# Patient Record
Sex: Female | Born: 1935 | Race: Black or African American | Hispanic: No | Smoking: Never smoker
Health system: Southern US, Community
[De-identification: ages and names within clinical notes are randomized; demographics above are authoritative.]

## PROBLEM LIST (undated history)

## (undated) DIAGNOSIS — K529 Noninfective gastroenteritis and colitis, unspecified: Secondary | ICD-10-CM

## (undated) DIAGNOSIS — R55 Syncope and collapse: Secondary | ICD-10-CM

## (undated) DIAGNOSIS — I4891 Unspecified atrial fibrillation: Secondary | ICD-10-CM

## (undated) DIAGNOSIS — I10 Essential (primary) hypertension: Secondary | ICD-10-CM

## (undated) DIAGNOSIS — C55 Malignant neoplasm of uterus, part unspecified: Secondary | ICD-10-CM

## (undated) DIAGNOSIS — M1990 Unspecified osteoarthritis, unspecified site: Secondary | ICD-10-CM

## (undated) HISTORY — PX: JOINT REPLACEMENT: SHX530

---

## 1998-07-30 ENCOUNTER — Encounter: Payer: Self-pay | Admitting: *Deleted

## 1998-07-30 ENCOUNTER — Ambulatory Visit: Admission: RE | Admit: 1998-07-30 | Discharge: 1998-07-30 | Payer: Self-pay | Admitting: *Deleted

## 2001-08-29 ENCOUNTER — Emergency Department (HOSPITAL_COMMUNITY): Admission: EM | Admit: 2001-08-29 | Discharge: 2001-08-29 | Payer: Self-pay | Admitting: Emergency Medicine

## 2007-07-27 HISTORY — PX: TOTAL KNEE ARTHROPLASTY: SHX125

## 2007-07-31 ENCOUNTER — Inpatient Hospital Stay (HOSPITAL_COMMUNITY): Admission: RE | Admit: 2007-07-31 | Discharge: 2007-08-04 | Payer: Self-pay | Admitting: Specialist

## 2010-06-11 ENCOUNTER — Emergency Department (HOSPITAL_COMMUNITY)
Admission: EM | Admit: 2010-06-11 | Discharge: 2010-06-12 | Payer: Self-pay | Source: Home / Self Care | Admitting: Emergency Medicine

## 2010-06-11 ENCOUNTER — Emergency Department (HOSPITAL_COMMUNITY): Admission: EM | Admit: 2010-06-11 | Discharge: 2010-06-11 | Payer: Self-pay | Admitting: Emergency Medicine

## 2010-12-08 LAB — POCT I-STAT, CHEM 8
BUN: 9 mg/dL (ref 6–23)
Calcium, Ion: 1 mmol/L — ABNORMAL LOW (ref 1.12–1.32)
Chloride: 106 mEq/L (ref 96–112)
Creatinine, Ser: 0.7 mg/dL (ref 0.4–1.2)
Glucose, Bld: 107 mg/dL — ABNORMAL HIGH (ref 70–99)
HCT: 50 % — ABNORMAL HIGH (ref 36.0–46.0)
Hemoglobin: 17 g/dL — ABNORMAL HIGH (ref 12.0–15.0)
Potassium: 3.9 mEq/L (ref 3.5–5.1)
Sodium: 139 mEq/L (ref 135–145)
TCO2: 27 mmol/L (ref 0–100)

## 2010-12-08 LAB — CBC
HCT: 46.3 % — ABNORMAL HIGH (ref 36.0–46.0)
Hemoglobin: 15.4 g/dL — ABNORMAL HIGH (ref 12.0–15.0)
MCH: 27.9 pg (ref 26.0–34.0)
MCHC: 33.3 g/dL (ref 30.0–36.0)
MCV: 84 fL (ref 78.0–100.0)
Platelets: 165 10*3/uL (ref 150–400)
RBC: 5.51 MIL/uL — ABNORMAL HIGH (ref 3.87–5.11)
RDW: 13.2 % (ref 11.5–15.5)
WBC: 6.1 10*3/uL (ref 4.0–10.5)

## 2010-12-08 LAB — DIFFERENTIAL
Basophils Absolute: 0 10*3/uL (ref 0.0–0.1)
Basophils Relative: 1 % (ref 0–1)
Eosinophils Absolute: 0 10*3/uL (ref 0.0–0.7)
Eosinophils Relative: 0 % (ref 0–5)
Lymphocytes Relative: 17 % (ref 12–46)
Lymphs Abs: 1.1 10*3/uL (ref 0.7–4.0)
Monocytes Absolute: 0.4 10*3/uL (ref 0.1–1.0)
Monocytes Relative: 6 % (ref 3–12)
Neutro Abs: 4.6 10*3/uL (ref 1.7–7.7)
Neutrophils Relative %: 76 % (ref 43–77)

## 2010-12-08 LAB — URINE CULTURE
Colony Count: 15000
Culture  Setup Time: 201109181843

## 2010-12-08 LAB — URINALYSIS, ROUTINE W REFLEX MICROSCOPIC
Ketones, ur: NEGATIVE mg/dL
Protein, ur: NEGATIVE mg/dL
Urobilinogen, UA: 0.2 mg/dL (ref 0.0–1.0)

## 2010-12-08 LAB — URINE MICROSCOPIC-ADD ON

## 2011-02-07 NOTE — H&P (Signed)
NAME:  Amanda Rocha, Amanda Rocha NO.:  [id number]   MEDICAL RECORD NO.:  [id number]         PATIENT TYPE:  LINP   LOCATION:                               FACILITY:  [facility]   PHYSICIAN:  Erasmo Leventhal, M.D.DATE OF BIRTH:  03-06-1921   DATE OF ADMISSION:  07/31/2007  DATE OF DISCHARGE:                              HISTORY & PHYSICAL   CHIEF COMPLAINT:  Left knee end-stage osteoarthritis.   HISTORY OF PRESENT ILLNESS:  This is an 77 year old lady with a history  of bilateral knee osteoarthritis, left greater than right, who has  failed conservative management to control her pain and discomfort.  Due  to daily pain with daily activities she is now scheduled for total knee  arthroplasty of the left knee.  The surgery risks, benefits and  aftercare were discussed in detail with the patient, questions invited  and answered.  She has received medical clearance from her medical  doctor, Dr. Fleet Contras at [facility] and surgery will go  ahead as scheduled.   PAST MEDICAL HISTORY:  Drug allergies:  None.   Current medications:  1. Benicar/hydrochlorothiazide 20/12.5 one daily.  2. Verapamil 240 mg one daily.  3. Polyethylene glycol 3350 mg 8 ounces daily.   Past surgeries:  Tubal excision for ectopic pregnancy.  Serious medical  illnesses:  Hypertension.   FAMILY HISTORY:  Positive for colon cancer.   SOCIAL HISTORY:  The patient is widowed.  She lives at home with her  son.  She does not smoke and does not drink.   REVIEW OF SYSTEMS:  CENTRAL NERVOUS SYSTEM:  Negative for headache  blurry vision or dizziness.  PULMONARY:  Negative for shortness breath,  PND or orthopnea.  CARDIOVASCULAR:  Negative for chest pain,  palpitation.  GI:  Negative for ulcers, hepatitis.  GU: Negative for  urinary tract difficulty.  MUSCULOSKELETAL:  Positive as in HPI.   PHYSICAL EXAMINATION:  BP 142/78, respirations 16, pulse 72 and regular.  GENERAL APPEARANCE:  This is a  well-developed, well-nourished lady in no  acute distress.  HEENT:  Head is normocephalic.  Nose patent.  Ears patent.  Pupils  equal, round, reactive to light.  Throat without injection.  NECK:  Supple without adenopathy.  Carotids 2+ without bruit.  CHEST:  Clear to auscultation.  No rales or rhonchi.  Respirations 16.  HEART:  Regular rate and rhythm at 72 beats per without murmur.  ABDOMEN:  Soft with active bowel sounds.  No masses or organomegaly.  NEUROLOGIC:  The patient alert and oriented to time, place and person.  Cranial nerves II-XII grossly intact.  EXTREMITIES:  Shows left knee with a varus deformity, 0-95 degree range  of motion with pain at the end.  Neurovascular status intact.  Dorsalis  pedis and posterior tibialis pulses are 1+.   X-rays show end-stage osteoarthritis left knee.   IMPRESSION:  End-stage osteoarthritis left knee.   PLAN:  Total knee arthroplasty left knee.      Jaquelyn Bitter. Chabon, P.A.    ______________________________  Erasmo Leventhal, M.D.    SJC/MEDQ  D:  07/17/2007  T:  07/18/2007  Job:  [id number]

## 2011-02-07 NOTE — Op Note (Signed)
NAME:  Amanda Rocha, Amanda Rocha NO.:  [id number]   MEDICAL RECORD NO.:  [id number]          PATIENT TYPE:  INP   LOCATION:  1614                         FACILITY:  [facility]   PHYSICIAN:  Erasmo Leventhal, M.D.DATE OF BIRTH:  11/13/1920   DATE OF PROCEDURE:  07/31/2007  DATE OF DISCHARGE:                               OPERATIVE REPORT   PREOPERATIVE DIAGNOSES:  Left knee end stage osteoarthritis.   POSTOPERATIVE DIAGNOSES:  Left knee end stage osteoarthritis.   PROCEDURE:  Left total knee arthroplasty.   SURGEON:  Erasmo Leventhal, M.D.   ASSISTANT:  Jaquelyn Bitter. Chabon, P.A.   ANESTHESIA:  Spinal with monitored anesthesia care.   BLOOD LOSS:  Less than 100 mL.   DRAINS:  One medium Hemovac.   COMPLICATIONS:  None.   TOURNIQUET TIME:  Seventy-five minutes at 300 mm mercury.   DESCRIPTION OF PROCEDURE:  The patient was counseled in the holding  area.  IV was started.  Taken to the operating room.  Spinal anesthetic  was administered.  IV antibiotics were given.  Foley catheter placed  utilizing sterile technique by the OR circulating nurse.  Left knee was  examined, full extension and flexion to 110.  Elevated, prepped with  DuraPrep and draped in a sterile fashion.  Exsanguinated with an Esmarch  and tourniquet inflated to 300 mmHg.  Straight midline incision was made  in the skin and subcutaneous tissue.  Medial soft tissue flaps were  developed.  Medial parapatellar arthrotomy was performed.  Proximal  medial soft tissue release was done.  Knee was then flexed.  End-stage  arthritis changes, bone against bone.  Cruciate ligaments were resected.  Starter hole made distal femur, canal was irrigated and effluent was  clear.  Intermedullary rod was gently placed.  We chose a 5 degree  valgus cut and took a 10 mm cut off the distal femur.  Distal femur had  multiple osteophytes.  They were removed.  The distal femur was found to  be a size #4.  Rotation marks  were set.  This fit the cut to size #4.  Medial and lateral menisci removed under direct visualization.  Geniculate vessels were coagulated.  Osteophytes removed across the  medial tibia.  The tibia was resected, proximal tibia found be a size  #3.  The center aspect was identified, step reamer, irrigated.  The  intermedullary rod was gently placed.  It took a 0 degree slope and took  a 10-mm cut off the lateral side which went to the deep edge on the  medial side.  Posterior medial and posterior femoral osteophytes were  gently removed under direct visualization.  With flexion extension  blocks for 10 we were well balanced.  Tibial baseplate was applied.  Rotation cover set, reamer and punch performed, box cut was now  performed on the femur.  At this time a size four femur, size three  tibia, 10 insert well balanced and aligned knee, patellofemoral track  was anatomic.  Patella was found to be a size 35.  Osteophytes were  removed.  Appropriate amount of bone was resected.  Locking holes were  made and patella button tracked anatomically.  All trials removed and  the knee was irrigated with pulsatile lavage.  Utilizing modern cement  technique, all components were cemented in place, size four femur, size  three tibia with the 35 mm patella.  The cement cured.  Excess cement  was removed.  The knee was again irrigated.  With a 12.5 trial insert we  had full extension, flexion to  120, length __________ .  Stable to  varus and valgus stress.  Patellofemoral tracking was anatomic.  The  trial was removed.  The knee was again irrigated and a final 12.5 mm  posterior stabilized rotating platform tibial insert was implanted.  Knee was then rechecked, well-balanced. excellent motion and stability  and patella track was anatomic.  Irrigated again.  Medium Hemovac drain  was placed.  Sequential closure layers done.  The arthrotomy was closed  with Vicryl at 90 degrees of flexion, subcu Vicryl,  skin  with a  subcuticular Monocryl suture.  Steri-Strips were applied.  Drain was  hooked to suction.  Sterile dressings applied.  Tourniquet was deflated.  Another gram of Ancef given at the end of the case.  Excellent  circulation of the foot and ankle at the end of the case.  She was then  taken to the recovery room in stable condition.  Sponge and instrument  count correct.  No complications or problems.   Implants used were [employer] Sigma size four femur, size  three tibia. 12.5 posterior stabilized rotating platform tibial insert,  35 patella, all cemented.   To help with surgical decision making and technique, Mr. Leilani Able,  PA-C assisted in the entire case.           ______________________________  Erasmo Leventhal, M.D.     RAC/MEDQ  D:  07/31/2007  T:  08/01/2007  Job:  [id number]

## 2011-02-10 NOTE — Discharge Summary (Signed)
Amanda Rocha, Amanda Rocha NO.:  [id number]   MEDICAL RECORD NO.:  [id number]          PATIENT TYPE:  INP   LOCATION:  1614                         FACILITY:  [facility]   PHYSICIAN:  Erasmo Leventhal, M.D.DATE OF BIRTH:  02-27-1921   DATE OF ADMISSION:  07/31/2007  DATE OF DISCHARGE:  08/04/2007                               DISCHARGE SUMMARY   ADMISSION DIAGNOSIS:  End-stage osteoarthritis left knee.   DISCHARGE DIAGNOSIS:  End-stage osteoarthritis left knee.   OPERATION:  Total knee arthroplasty right knee.   BRIEF HISTORY:  This is an 77 year old lady with a history of end-stage  osteoarthritis which has failed conservative management.  After  discussion of treatment options, the patient now admitted for total knee  arthroplasty.  Surgery's risks, benefits and aftercare were status in  detail with the patient.  Questions invited and answered.   LABORATORY VALUES:  Admission CBC within normal limits.  Hemoglobin/hematocrit reached a low of 8.5 and 25.3 on the December 8,  and came up on their own to 9.2 and 27.5.  Admission C-met showed the  glucose to be elevated.  She ran mildly elevated glucose throughout  admission with a high of 179.  Admission PT/PTT within normal limits  except PTT 41.  Admission urinalysis showed cloudy color, trace ketones  and trace leukocyte esterase.  Chest x-ray showed no cardiopulmonary  disease.   COURSE IN THE HOSPITAL:  The patient tolerated the operative procedure  well.  First postoperative day vital signs were stable.  She is feeling  good.  INR was good.  O2 sats 99 on 2 liters.  Hemoglobin 11.7,  hematocrit 33.3.  B-met within normal with the exception of glucose 179.  Lungs clear.  Heart sounds normal.  Bowel sounds sluggish.  Calves are  negative.  Drain was removed without difficulty.  She was switched to a  modified carb diet.  Second postoperative day, vital signs were stable.  Blood pressure was good.  Temperature  to 101.3, O2 90 on 2 liters.  I&O  is good.  Lung sounds with decreased sounds at the bases.  Heart sounds  normal.  Bowel sounds active.  Calves negative.  Dressing was changed.  Wound was benign.  The patient's IVs were switched to [facility].  She was put  on increased incentive spirometry, cough and deep breathe and discharge  plan was made for Sunday due to her continued mild hypertension, medical  consult was obtained and this was brought under control.  Postop day #3,  vital signs stable, afebrile.  She had mild hypokalemia which was  treated with p.o. K-Dur and the problem was encountered there.  Dressings were changed and wound was benign.  Calves were negative and  discharge plans were made for the following day and on the fourth  postoperative day in good condition with afebrile vital signs stable.  Hemoglobin/hematocrit acceptable.  Lungs clear.  Heart sounds normal.  Bowel sounds active and calves negative and dressed and the wound  benign.  The patient was discharged home for follow-up in the office.   CONDITION ON DISCHARGE:  Improved.  DISCHARGE MEDICATIONS:  1. Percocet 1-2 q.4-6 h p.r.n. pain.  2. Robaxin 500 one p.o. q.h.s. daily p.r.n. spasm.  3. Trinsicon one b.i.d. for anemia.  4. Lovenox 30 mg one shot each day at 6:00 a.m. and 6:00 p.m. and then      start taking 181 mg aspirin a day for 3 weeks after the Lovenox.      She is to be on a regular diet.  5. She is take metoprolol 25 mg 1/2 tablet p.o. q.a.m. and 1/2 tablet      p.o. q.p.m. until follow-up with her medical doctor.   DISCHARGE INSTRUCTIONS:  She is to keep the wound clean and dry, do her  home exercise and call if problems or questions arise.      Jaquelyn Bitter. Chabon, P.A.    ______________________________  Erasmo Leventhal, M.D.    SJC/MEDQ  D:  09/03/2007  T:  09/04/2007  Job:  [id number]

## 2011-07-04 LAB — CBC
HCT: 27.9 — ABNORMAL LOW
HCT: 33.3 — ABNORMAL LOW
Hemoglobin: 9.3 — ABNORMAL LOW
MCHC: 33.7
MCV: 82.3
MCV: 82.6
Platelets: 137 — ABNORMAL LOW
Platelets: 197
RBC: 3.38 — ABNORMAL LOW
WBC: 10
WBC: 6.1

## 2011-07-04 LAB — BASIC METABOLIC PANEL
BUN: 1 — ABNORMAL LOW
BUN: 4 — ABNORMAL LOW
BUN: 6
CO2: 31
Chloride: 100
Chloride: 98
Creatinine, Ser: 0.68
GFR calc Af Amer: >60
GFR calc non Af Amer: >60
Glucose, Bld: 117 — ABNORMAL HIGH
Potassium: 3.5
Potassium: 3.8

## 2011-07-04 LAB — HEMOGLOBIN AND HEMATOCRIT, BLOOD: Hemoglobin: 9.2 — ABNORMAL LOW

## 2011-07-04 LAB — CROSSMATCH: Antibody Screen: NEGATIVE

## 2011-07-05 LAB — DIFFERENTIAL
Basophils Absolute: 0
Basophils Relative: 1
Eosinophils Absolute: 0.1
Eosinophils Relative: 1
Monocytes Absolute: 0.4
Monocytes Relative: 7
Neutro Abs: 3.1

## 2011-07-05 LAB — URINALYSIS, ROUTINE W REFLEX MICROSCOPIC
Bilirubin Urine: NEGATIVE
Hgb urine dipstick: NEGATIVE
Nitrite: NEGATIVE
Specific Gravity, Urine: 1.023
pH: 5.5

## 2011-07-05 LAB — CBC
HCT: 41.1
Platelets: 228
WBC: 4.8

## 2011-07-05 LAB — COMPREHENSIVE METABOLIC PANEL
ALT: 13
AST: 19
Albumin: 3.9
Alkaline Phosphatase: 70
BUN: 12
Chloride: 99
Potassium: 5
Sodium: 140
Total Bilirubin: 0.7
Total Protein: 7

## 2011-07-05 LAB — URINE MICROSCOPIC-ADD ON

## 2015-04-23 DIAGNOSIS — H5203 Hypermetropia, bilateral: Secondary | ICD-10-CM | POA: Diagnosis not present

## 2017-04-24 ENCOUNTER — Encounter (HOSPITAL_COMMUNITY): Payer: Self-pay | Admitting: Emergency Medicine

## 2017-04-24 ENCOUNTER — Inpatient Hospital Stay (HOSPITAL_COMMUNITY)
Admission: EM | Admit: 2017-04-24 | Discharge: 2017-04-29 | DRG: 310 | Disposition: A | Discharge status: Home / Self Care | Payer: Medicare Other | Attending: Internal Medicine | Admitting: Internal Medicine

## 2017-04-24 ENCOUNTER — Observation Stay (HOSPITAL_COMMUNITY): Payer: Medicare Other

## 2017-04-24 ENCOUNTER — Emergency Department (HOSPITAL_COMMUNITY): Payer: Medicare Other

## 2017-04-24 DIAGNOSIS — D638 Anemia in other chronic diseases classified elsewhere: Secondary | ICD-10-CM | POA: Diagnosis present

## 2017-04-24 DIAGNOSIS — I4891 Unspecified atrial fibrillation: Secondary | ICD-10-CM

## 2017-04-24 DIAGNOSIS — K310 Acute dilatation of stomach: Secondary | ICD-10-CM

## 2017-04-24 DIAGNOSIS — I480 Paroxysmal atrial fibrillation: Secondary | ICD-10-CM | POA: Diagnosis not present

## 2017-04-24 DIAGNOSIS — R739 Hyperglycemia, unspecified: Secondary | ICD-10-CM

## 2017-04-24 DIAGNOSIS — K529 Noninfective gastroenteritis and colitis, unspecified: Secondary | ICD-10-CM

## 2017-04-24 DIAGNOSIS — Z833 Family history of diabetes mellitus: Secondary | ICD-10-CM

## 2017-04-24 DIAGNOSIS — I4892 Unspecified atrial flutter: Secondary | ICD-10-CM | POA: Diagnosis present

## 2017-04-24 DIAGNOSIS — Z8249 Family history of ischemic heart disease and other diseases of the circulatory system: Secondary | ICD-10-CM

## 2017-04-24 DIAGNOSIS — R55 Syncope and collapse: Secondary | ICD-10-CM | POA: Diagnosis not present

## 2017-04-24 DIAGNOSIS — R778 Other specified abnormalities of plasma proteins: Secondary | ICD-10-CM | POA: Diagnosis present

## 2017-04-24 DIAGNOSIS — I444 Left anterior fascicular block: Secondary | ICD-10-CM | POA: Diagnosis present

## 2017-04-24 DIAGNOSIS — Z7982 Long term (current) use of aspirin: Secondary | ICD-10-CM

## 2017-04-24 DIAGNOSIS — I10 Essential (primary) hypertension: Secondary | ICD-10-CM | POA: Diagnosis not present

## 2017-04-24 DIAGNOSIS — R748 Abnormal levels of other serum enzymes: Secondary | ICD-10-CM | POA: Diagnosis not present

## 2017-04-24 DIAGNOSIS — Z79899 Other long term (current) drug therapy: Secondary | ICD-10-CM

## 2017-04-24 DIAGNOSIS — E860 Dehydration: Secondary | ICD-10-CM | POA: Diagnosis present

## 2017-04-24 DIAGNOSIS — K8689 Other specified diseases of pancreas: Secondary | ICD-10-CM | POA: Diagnosis present

## 2017-04-24 DIAGNOSIS — D500 Iron deficiency anemia secondary to blood loss (chronic): Secondary | ICD-10-CM | POA: Diagnosis present

## 2017-04-24 DIAGNOSIS — R7989 Other specified abnormal findings of blood chemistry: Secondary | ICD-10-CM

## 2017-04-24 DIAGNOSIS — R195 Other fecal abnormalities: Secondary | ICD-10-CM | POA: Diagnosis present

## 2017-04-24 HISTORY — DX: Unspecified atrial fibrillation: I48.91

## 2017-04-24 HISTORY — DX: Unspecified osteoarthritis, unspecified site: M19.90

## 2017-04-24 HISTORY — DX: Noninfective gastroenteritis and colitis, unspecified: K52.9

## 2017-04-24 HISTORY — DX: Syncope and collapse: R55

## 2017-04-24 HISTORY — DX: Malignant neoplasm of uterus, part unspecified: C55

## 2017-04-24 HISTORY — DX: Essential (primary) hypertension: I10

## 2017-04-24 LAB — HEPATIC FUNCTION PANEL
ALBUMIN: 3.7 g/dL (ref 3.5–5.0)
ALK PHOS: 70 U/L (ref 38–126)
ALT: 10 U/L — ABNORMAL LOW (ref 14–54)
AST: 29 U/L (ref 15–41)
Bilirubin, Direct: 0.2 mg/dL (ref 0.1–0.5)
Indirect Bilirubin: 0.4 mg/dL (ref 0.3–0.9)
TOTAL PROTEIN: 6.7 g/dL (ref 6.5–8.1)
Total Bilirubin: 0.6 mg/dL (ref 0.3–1.2)

## 2017-04-24 LAB — CBC
HCT: 43.7 % (ref 36.0–46.0)
Hemoglobin: 13.4 g/dL (ref 12.0–15.0)
MCH: 25.8 pg — AB (ref 26.0–34.0)
MCHC: 30.7 g/dL (ref 30.0–36.0)
MCV: 84.2 fL (ref 78.0–100.0)
PLATELETS: 245 10*3/uL (ref 150–400)
RBC: 5.19 MIL/uL — ABNORMAL HIGH (ref 3.87–5.11)
RDW: 13.9 % (ref 11.5–15.5)
WBC: 10.1 10*3/uL (ref 4.0–10.5)

## 2017-04-24 LAB — BASIC METABOLIC PANEL
Anion gap: 13 (ref 5–15)
BUN: 16 mg/dL (ref 6–20)
CHLORIDE: 99 mmol/L — AB (ref 101–111)
CO2: 23 mmol/L (ref 22–32)
CREATININE: 1.08 mg/dL — AB (ref 0.44–1.00)
Calcium: 9.7 mg/dL (ref 8.9–10.3)
GFR calc Af Amer: 50 mL/min — ABNORMAL LOW (ref >60)
GFR calc non Af Amer: 43 mL/min — ABNORMAL LOW (ref >60)
GLUCOSE: 223 mg/dL — AB (ref 65–99)
Potassium: 3.4 mmol/L — ABNORMAL LOW (ref 3.5–5.1)
Sodium: 135 mmol/L (ref 135–145)

## 2017-04-24 LAB — URINALYSIS, ROUTINE W REFLEX MICROSCOPIC
Bilirubin Urine: NEGATIVE
GLUCOSE, UA: 50 mg/dL — AB
Ketones, ur: NEGATIVE mg/dL
Nitrite: NEGATIVE
PH: 6 (ref 5.0–8.0)
PROTEIN: NEGATIVE mg/dL
Specific Gravity, Urine: 1.016 (ref 1.005–1.030)

## 2017-04-24 LAB — LIPASE, BLOOD: Lipase: 24 U/L (ref 11–51)

## 2017-04-24 LAB — TSH: TSH: 0.446 u[IU]/mL (ref 0.350–4.500)

## 2017-04-24 LAB — I-STAT TROPONIN, ED: TROPONIN I, POC: 0.07 ng/mL (ref 0.00–0.08)

## 2017-04-24 LAB — CBG MONITORING, ED: Glucose-Capillary: 177 mg/dL — ABNORMAL HIGH (ref 65–99)

## 2017-04-24 LAB — TROPONIN I: Troponin I: 0.12 ng/mL (ref <0.03)

## 2017-04-24 MED ORDER — ENOXAPARIN SODIUM 30 MG/0.3ML ~~LOC~~ SOLN
30.0000 mg | SUBCUTANEOUS | Status: DC
  Administered 2017-04-24 – 2017-04-28 (×4): 30 mg via SUBCUTANEOUS
  Filled 2017-04-24 (×4): qty 0.3

## 2017-04-24 MED ORDER — ASPIRIN EC 81 MG PO TBEC
81.0000 mg | DELAYED_RELEASE_TABLET | Freq: Every day | ORAL | Status: DC
  Administered 2017-04-25: 81 mg via ORAL
  Filled 2017-04-24 (×2): qty 1

## 2017-04-24 MED ORDER — SODIUM CHLORIDE 0.9 % IV SOLN
INTRAVENOUS | Status: AC
  Administered 2017-04-24: 21:00:00 via INTRAVENOUS

## 2017-04-24 MED ORDER — ONDANSETRON HCL 4 MG/2ML IJ SOLN
4.0000 mg | Freq: Four times a day (QID) | INTRAMUSCULAR | Status: DC | PRN
  Administered 2017-04-25 – 2017-04-27 (×4): 4 mg via INTRAVENOUS
  Filled 2017-04-24 (×5): qty 2

## 2017-04-24 MED ORDER — AMLODIPINE BESYLATE 5 MG PO TABS
5.0000 mg | ORAL_TABLET | Freq: Every day | ORAL | Status: DC
  Administered 2017-04-25 – 2017-04-28 (×4): 5 mg via ORAL
  Filled 2017-04-24 (×5): qty 1

## 2017-04-24 MED ORDER — INSULIN ASPART 100 UNIT/ML ~~LOC~~ SOLN
0.0000 [IU] | Freq: Three times a day (TID) | SUBCUTANEOUS | Status: DC
  Administered 2017-04-24 – 2017-04-27 (×2): 3 [IU] via SUBCUTANEOUS
  Filled 2017-04-24: qty 1

## 2017-04-24 MED ORDER — ACETAMINOPHEN 650 MG RE SUPP: 650.0000 mg | Freq: Four times a day (QID) | RECTAL | Status: DC | PRN

## 2017-04-24 MED ORDER — MORPHINE SULFATE (PF) 4 MG/ML IV SOLN
2.0000 mg | INTRAVENOUS | Status: DC | PRN
  Administered 2017-04-24: 2 mg via INTRAVENOUS
  Filled 2017-04-24: qty 1

## 2017-04-24 MED ORDER — ONDANSETRON HCL 4 MG PO TABS
4.0000 mg | ORAL_TABLET | Freq: Four times a day (QID) | ORAL | Status: DC | PRN
  Administered 2017-04-24 – 2017-04-25 (×2): 4 mg via ORAL
  Filled 2017-04-24 (×2): qty 1

## 2017-04-24 MED ORDER — SENNOSIDES-DOCUSATE SODIUM 8.6-50 MG PO TABS
1.0000 | ORAL_TABLET | Freq: Every evening | ORAL | Status: DC | PRN
  Administered 2017-04-28: 1 via ORAL
  Filled 2017-04-24: qty 1

## 2017-04-24 MED ORDER — ENOXAPARIN SODIUM 40 MG/0.4ML ~~LOC~~ SOLN
40.0000 mg | SUBCUTANEOUS | Status: DC
  Filled 2017-04-24: qty 0.4

## 2017-04-24 MED ORDER — HYDROCODONE-ACETAMINOPHEN 5-325 MG PO TABS
1.0000 | ORAL_TABLET | ORAL | Status: DC | PRN
  Administered 2017-04-24 – 2017-04-25 (×3): 1 via ORAL
  Filled 2017-04-24 (×3): qty 1

## 2017-04-24 MED ORDER — METRONIDAZOLE IN NACL 5-0.79 MG/ML-% IV SOLN
500.0000 mg | Freq: Three times a day (TID) | INTRAVENOUS | Status: DC
  Administered 2017-04-24 – 2017-04-25 (×2): 500 mg via INTRAVENOUS
  Filled 2017-04-24 (×4): qty 100

## 2017-04-24 MED ORDER — SODIUM CHLORIDE 0.9 % IV BOLUS (SEPSIS)
500.0000 mL | Freq: Once | INTRAVENOUS | Status: AC
  Administered 2017-04-24: 500 mL via INTRAVENOUS

## 2017-04-24 MED ORDER — DILTIAZEM HCL 25 MG/5ML IV SOLN
5.0000 mg | Freq: Once | INTRAVENOUS | Status: AC
  Administered 2017-04-24: 5 mg via INTRAVENOUS
  Filled 2017-04-24: qty 5

## 2017-04-24 MED ORDER — ACETAMINOPHEN 325 MG PO TABS: 650.0000 mg | ORAL_TABLET | Freq: Four times a day (QID) | ORAL | Status: DC | PRN

## 2017-04-24 MED ORDER — SODIUM CHLORIDE 0.9% FLUSH
3.0000 mL | Freq: Two times a day (BID) | INTRAVENOUS | Status: DC
  Administered 2017-04-24 – 2017-04-28 (×4): 3 mL via INTRAVENOUS

## 2017-04-24 MED ORDER — METOPROLOL TARTRATE 5 MG/5ML IV SOLN
5.0000 mg | Freq: Four times a day (QID) | INTRAVENOUS | Status: DC
  Administered 2017-04-24 – 2017-04-25 (×2): 5 mg via INTRAVENOUS
  Filled 2017-04-24 (×2): qty 5

## 2017-04-24 MED ORDER — IOPAMIDOL (ISOVUE-300) INJECTION 61%
INTRAVENOUS | Status: AC
  Administered 2017-04-24: 100 mL
  Filled 2017-04-24: qty 100

## 2017-04-24 MED ORDER — ONDANSETRON HCL 4 MG/2ML IJ SOLN
4.0000 mg | Freq: Once | INTRAMUSCULAR | Status: AC
  Administered 2017-04-24: 4 mg via INTRAVENOUS
  Filled 2017-04-24: qty 2

## 2017-04-24 MED ORDER — MORPHINE SULFATE (PF) 4 MG/ML IV SOLN: 1.0000 mg | INTRAVENOUS | Status: DC | PRN

## 2017-04-24 MED ORDER — TRAZODONE HCL 50 MG PO TABS: 25.0000 mg | ORAL_TABLET | Freq: Every evening | ORAL | Status: DC | PRN

## 2017-04-24 NOTE — ED Notes (Signed)
Pt received, aaox4, mae x 4 . Son in room with his mother

## 2017-04-24 NOTE — ED Notes (Signed)
DINNER TRAY ORDERED; LIQUID DIET

## 2017-04-24 NOTE — ED Triage Notes (Signed)
Per [facility]: pt having palpitations that began at 10:00 this am.  When EMS arrived pt had a syncopal episode that lasted approx 30 seconds while sitting on the bed.  Following episode pt experienced N/V with a BP of 86/42.  Denies CP.  Pt was given 4mg  of zofran and 500 ml NS bolus.  PTA vitals: 133/56 BP, 104 HR, 148 CBG, 96 SPO2 on RA, 18R.  Pt is A/O x4 upon arrival.

## 2017-04-24 NOTE — ED Provider Notes (Addendum)
[facility] DEPT Provider Note   CSN: [id number] Arrival date & time: 04/24/17  1249     History   Chief Complaint Chief Complaint  Patient presents with  . Palpitations  . Loss of Consciousness    HPI Amanda Rocha is a 83 y.o. female.  The history is provided by the patient.  Palpitations   This is a new problem. The current episode started 1 to 2 hours ago. The problem occurs rarely. The problem has been resolved. The problem is associated with an unknown factor. Associated symptoms include irregular heartbeat, near-syncope, abdominal pain, nausea and vomiting. Pertinent negatives include no fever, no chest pressure, no exertional chest pressure, no back pain, no lower extremity edema, no cough and no shortness of breath. She has tried nothing for the symptoms.   83 year old female who presents with palpitations and syncope. She has a history of hypertension. Reports having sudden onset of palpitations that began at 10 AM this morning, resolved on arrival to the ED. When EMS arrived, she had a syncopal episode that lasted for about 30 seconds when she was sitting in bed. Patient reports that she did not have any syncope. States that when she got up to go to the kitchen to get a glass of water she did feel extremely lightheaded, sick, and weak generally. After her unresponsive episode, she did have episode of nausea and vomiting began to complain of generalized abdominal pain. No diarrhea, melena, hematochezia, chest pain or difficulty breathing. She did receive antibiotics and 500 mL bolus as her blood pressure was low 86/42. On arrival to the ED she says that she feels sick/unwell. She did have a large bowel movement while here in the emergency department but continues to have generalized abdominal pain.  PCP: [facility]   Past Medical History:  Diagnosis Date  . Hypertension     There are no active problems to display for this patient.   No past surgical  history on file.  OB History    No data available       Home Medications    Prior to Admission medications   Medication Sig Start Date End Date Taking? Authorizing Provider  amLODipine (NORVASC) 5 MG tablet Take 5 mg by mouth daily. 01/16/17  Yes [provider]  aspirin EC 81 MG tablet Take 81 mg by mouth daily.   Yes [provider]  ENSURE (ENSURE) Take 237 mLs by mouth daily.   Yes [provider]  lisinopril-hydrochlorothiazide (PRINZIDE,ZESTORETIC) 20-12.5 MG tablet Take 1 tablet by mouth daily. 03/23/17  Yes [provider]  SENNA PO Take 1 Package by mouth as needed. Senna leaves tea   Yes [provider]    Family History No family history on file.  Social History Social History  Substance Use Topics  . Smoking status: Not on file  . Smokeless tobacco: Not on file  . Alcohol use No     Allergies   Patient has no known allergies.   Review of Systems Review of Systems  Constitutional: Negative for fever.  Respiratory: Negative for cough and shortness of breath.   Cardiovascular: Positive for near-syncope.  Gastrointestinal: Positive for abdominal pain, nausea and vomiting. Negative for anal bleeding and blood in stool.  Genitourinary: Negative for dysuria.  Musculoskeletal: Negative for back pain.  All other systems reviewed and are negative.    Physical Exam Updated Vital Signs BP (!) 167/75   Pulse (!) 129   Temp 97.8 F (36.6 C) (Oral)  Resp (!) 22   Ht 5\' 4"  (1.626 m)   Wt 61.2 kg (135 lb)   SpO2 98%   BMI 23.17 kg/m   Physical Exam Physical Exam  Nursing note and vitals reviewed. Constitutional: Well developed, well nourished, non-toxic, and in no acute distress Head: Normocephalic and atraumatic.  Mouth/Throat: Oropharynx is clear and moist.  Neck: Normal range of motion. Neck supple.  Cardiovascular: Normal rate and regular rhythm.   Pulmonary/Chest: Effort normal and breath sounds normal.    Abdominal: Soft. There is generalized tenderness. There is no rebound and no guarding.  Musculoskeletal: No deformities. No edema. Neurological: Alert, no facial droop, fluent speech, moves all extremities symmetrically, no pronator drift, equal bilateral hand grip, sensation grossly intact throughout Skin: Skin is warm and dry.  Psychiatric: Cooperative   ED Treatments / Results  Labs (all labs ordered are listed, but only abnormal results are displayed) Labs Reviewed  BASIC METABOLIC PANEL - Abnormal; Notable for the following:       Result Value   Potassium 3.4 (*)    Chloride 99 (*)    Glucose, Bld 223 (*)    Creatinine, Ser 1.08 (*)    GFR calc non Af Amer 43 (*)    GFR calc Af Amer 50 (*)    All other components within normal limits  CBC - Abnormal; Notable for the following:    RBC 5.19 (*)    MCH 25.8 (*)    All other components within normal limits  HEPATIC FUNCTION PANEL - Abnormal; Notable for the following:    ALT 10 (*)    All other components within normal limits  LIPASE, BLOOD  URINALYSIS, ROUTINE W REFLEX MICROSCOPIC  TROPONIN I  I-STAT TROPONIN, ED  CBG MONITORING, ED    EKG  EKG Interpretation  Date/Time:  Tuesday April 24 2017 12:56:19 EDT Ventricular Rate:  91 PR Interval:    QRS Duration: 92 QT Interval:  350 QTC Calculation: 431 R Axis:   -56 Text Interpretation:  Sinus rhythm Left anterior fascicular block LVH with secondary repolarization abnormality Anterior Q waves, possibly due to LVH PVCs new  otherwise no acute changes  Confirmed by Brantley Stage [phone]) on 04/24/2017 1:39:04 PM       Radiology Ct Abdomen Pelvis W Contrast  Result Date: 04/24/2017 CLINICAL DATA:  Abdominal pain EXAM: CT ABDOMEN AND PELVIS WITH CONTRAST TECHNIQUE: Multidetector CT imaging of the abdomen and pelvis was performed using the standard protocol following bolus administration of intravenous contrast. CONTRAST:  100 mL Isovue-300 COMPARISON:  None. FINDINGS: Lower  chest: No pulmonary nodules or pleural effusion. No visible pericardial effusion. Hepatobiliary: There is hepatic steatosis. No focal liver lesion. No biliary dilatation. Normal gallbladder. Pancreas: There is diffuse pancreatic ductal dilatation, measuring up to 7 mm. No focal lesion identified. Spleen: Normal. Adrenals/Urinary Tract: --Adrenal glands: Normal. --Right kidney/ureter: No hydronephrosis or perinephric stranding. No nephrolithiasis. No obstructing ureteral stones. 2.5 cm right renal cyst. --Left kidney/ureter: No hydronephrosis or perinephric stranding. No nephrolithiasis. No obstructing ureteral stones. --Urinary bladder: Unremarkable. Stomach/Bowel: --Stomach/Duodenum: No hiatal hernia or other gastric abnormality. Normal duodenal course and caliber. --Small bowel: No dilatation or inflammation. --Colon: There is wall thickening and surrounding inflammation of the distal transverse colon, descending colon and sigmoid colon. There is sigmoid diverticulosis. --Appendix: Normal. Vascular/Lymphatic: There is atherosclerotic calcification of the non aneurysmal abdominal aorta. No abdominal or pelvic lymphadenopathy. Reproductive: Small amount of free fluid in the pelvis. Uterus is normal. No adnexal mass. Musculoskeletal. Multilevel  degenerative disc disease and facet arthrosis. No bony spinal canal stenosis. Other: Small amounts of fluid adjacent to the descending colon. IMPRESSION: 1. Long segment inflammation of the colon extending from the distal transverse colon to the sigmoid colon. Small amount of surrounding free fluid but no abscess or drainable collection. Findings suggest an infectious or inflammatory colitis. 2. Diffuse dilatation of the pancreatic duct, measuring up to 7 mm. No obstructing lesion identified. MRI/MRCP of the abdomen with and without contrast is recommended to assess for possible ampullary mass. 3.  Aortic Atherosclerosis (ICD10-I70.0). Electronically Signed   By: Ulyses Jarred  M.D.   On: 04/24/2017 15:23    Procedures Procedures (including critical care time)  Medications Ordered in ED Medications  morphine 4 MG/ML injection 2 mg (not administered)  sodium chloride 0.9 % bolus 500 mL (500 mLs Intravenous New Bag/Given 04/24/17 1443)  ondansetron (ZOFRAN) injection 4 mg (4 mg Intravenous Given 04/24/17 1443)  iopamidol (ISOVUE-300) 61 % injection (100 mLs  Contrast Given 04/24/17 1455)     Initial Impression / Assessment and Plan / ED Course  I have reviewed the triage vital signs and the nursing notes.  Pertinent labs & imaging results that were available during my care of the patient were reviewed by me and considered in my medical decision making (see chart for details).     83 year old female who presents with syncopal episode. Although the patient denies true syncope just near-syncope, she did have a 30 second episode of unresponsiveness with EMS.  Her vitals are stable here in the emergency department. She brings primarily of abdominal pain. With diffuse tenderness, worse over the left side. I did obtain CT abdomen and pelvis, and this shows evidence of colitis. Her syncopal episode may be secondary to pain and vasovagal in nature. Blood work shows mild AKI.   Her EKG shows no ischemia but there is some mild LVH. Her palpitations preceding also makes me question potential arrhythmia. istat trop normal 0.07, but will obtain lab troponin.  Plan to admit to hospitalist service for colitis and syncopal evaluation.   Patient re-evaluated and noted to have intermittent tachycardia HR 90-120s. Appears to be afib on the monitor. Will given small bolus of diltiazem.   Discussed with Dyanne Carrel. Admit to hospitalist service.  Final Clinical Impressions(s) / ED Diagnoses   Final diagnoses:  Colitis  Syncope and collapse    New Prescriptions New Prescriptions   No medications on file     Forde Dandy, MD 04/24/17 1547    Forde Dandy,  MD 04/24/17 1600

## 2017-04-24 NOTE — ED Notes (Signed)
Pt used bedpan voided 500 cc cleansed

## 2017-04-24 NOTE — H&P (Signed)
History and Physical    Amanda Rocha NGE:[id number] DOB: December 11, 1920 DOA: 04/24/2017  PCP: Patient, No Pcp Per [place] Chard Patient coming from: home  Chief Complaint: syncope  HPI: Amanda Rocha is a delightful 83 y.o. female with medical history significant for hypertension presents to the emergency department with the chief complaint of palpitations and syncope. Initial evaluation reveals A. fib with RVR likely related to dehydration as a result of diarrhea.  Information is obtained from the patient and her son who is at the bedside and with whom she resides. She states she was in her usual state of health until this morning she got up felt very dizzy and had palpitations and called EMS. She states her symptoms had resolved but reportedly when EMS arrived she has syncopal episode that lasted for 30 seconds. She reports she "just feels bad". She reports some abdominal pain nausea with one episode of vomiting. She states the pain is constant and describes it as a cramp and located in her lower quadrants. She denies any diarrhea but does report some constipation for which she to "senna leaves" and had "very good results twice". In addition she had another bowel movement in the emergency department. She denies headache visual disturbances difficulty chewing or swallowing. She denies chest pain shortness of breath diaphoresis lower extremity edema. She denies dysuria hematuria frequency or urgency. She denies fever chills recent travel or sick contacts.    ED Course: Emergency department she is hypertensive with a heart rate of 116 she's not hypoxic. She is provided with 500 mL of normal saline. In the emergency department she has new onset A. fib with RVR. She is provided with 5 mg Cardizem. At the time of admission she is in A. fib with a heart rate of 90.  Review of Systems: As per HPI otherwise all other systems reviewed and are negative.   Ambulatory Status: She ambulates with a cane at  home. Gait is unsteady. No recent falls  Past Medical History:  Diagnosis Date  . A-fib ([place])   . Colitis   . Hypertension   . Syncope     No past surgical history on file.  Social History   Social History  . Marital status: Widowed    Spouse name: N/A  . Number of children: N/A  . Years of education: N/A   Occupational History  . Not on file.   Social History Main Topics  . Smoking status: Not on file  . Smokeless tobacco: Not on file  . Alcohol use No  . Drug use: No  . Sexual activity: Not on file   Other Topics Concern  . Not on file   Social History Narrative  . No narrative on file    No Known Allergies  Family History  Problem Relation Age of Onset  . Diabetes Mother   . Hypertension Father     Prior to Admission medications   Medication Sig Start Date End Date Taking? Authorizing Provider  amLODipine (NORVASC) 5 MG tablet Take 5 mg by mouth daily. 01/16/17  Yes [provider]  aspirin EC 81 MG tablet Take 81 mg by mouth daily.   Yes [provider]  ENSURE (ENSURE) Take 237 mLs by mouth daily.   Yes [provider]  lisinopril-hydrochlorothiazide (PRINZIDE,ZESTORETIC) 20-12.5 MG tablet Take 1 tablet by mouth daily. 03/23/17  Yes [provider]  SENNA PO Take 1 Package by mouth as needed. Senna leaves tea   Yes [provider]  Physical Exam: Vitals:   04/24/17 1545 04/24/17 1600 04/24/17 1605 04/24/17 1615  BP: (!) 132/95 (!) 145/73 (!) 141/86 (!) 161/94  Pulse: (!) 117   (!) 101  Resp: 11 16 15 16   Temp:      TempSrc:      SpO2: 99%   96%  Weight:      Height:         General:  Appears Thin, frail, calm and comfortable Eyes:  PERRL, EOMI, normal lids, iris ENT:  grossly normal hearing, lips & tongue, mucous membranes of her mouth are pink but very dry Neck:  no LAD, masses or thyromegaly Cardiovascular:  Irregularly irregular, no m/r/g. No LE edema.  Respiratory:  CTA bilaterally, no  w/r/r. Normal respiratory effort. Abdomen:  soft, mild diffuse tenderness particularly in the lower quadrants, positive bowel sounds throughout no guarding or rebounding Skin:  no rash or induration seen on limited exam Musculoskeletal:  grossly normal tone BUE/BLE, good ROM, no bony abnormality Psychiatric:  grossly normal mood and affect, speech fluent and appropriate, AOx3 Neurologic:  CN 2-12 grossly intact, moves all extremities in coordinated fashion, sensation intact  Labs on Admission: I have personally reviewed following labs and imaging studies  CBC:  Recent Labs Lab 04/24/17 1310  WBC 10.1  HGB 13.4  HCT 43.7  MCV 84.2  PLT 161   Basic Metabolic Panel:  Recent Labs Lab 04/24/17 1310  NA 135  K 3.4*  CL 99*  CO2 23  GLUCOSE 223*  BUN 16  CREATININE 1.08*  CALCIUM 9.7   GFR: Estimated Creatinine Clearance: 28.1 mL/min (A) (by C-G formula based on SCr of 1.08 mg/dL (H)). Liver Function Tests:  Recent Labs Lab 04/24/17 1338  AST 29  ALT 10*  ALKPHOS 70  BILITOT 0.6  PROT 6.7  ALBUMIN 3.7    Recent Labs Lab 04/24/17 1338  LIPASE 24   No results for input(s): AMMONIA in the last 168 hours. Coagulation Profile: No results for input(s): INR, PROTIME in the last 168 hours. Cardiac Enzymes: No results for input(s): CKTOTAL, CKMB, CKMBINDEX, TROPONINI in the last 168 hours. BNP (last 3 results) No results for input(s): PROBNP in the last 8760 hours. HbA1C: No results for input(s): HGBA1C in the last 72 hours. CBG: No results for input(s): GLUCAP in the last 168 hours. Lipid Profile: No results for input(s): CHOL, HDL, LDLCALC, TRIG, CHOLHDL, LDLDIRECT in the last 72 hours. Thyroid Function Tests: No results for input(s): TSH, T4TOTAL, FREET4, T3FREE, THYROIDAB in the last 72 hours. Anemia Panel: No results for input(s): VITAMINB12, FOLATE, FERRITIN, TIBC, IRON, RETICCTPCT in the last 72 hours. Urine analysis:    Component Value Date/Time    COLORURINE YELLOW 04/24/2017 1532   APPEARANCEUR HAZY (A) 04/24/2017 1532   LABSPEC 1.016 04/24/2017 1532   PHURINE 6.0 04/24/2017 1532   GLUCOSEU 50 (A) 04/24/2017 1532   HGBUR MODERATE (A) 04/24/2017 1532   BILIRUBINUR NEGATIVE 04/24/2017 1532   KETONESUR NEGATIVE 04/24/2017 1532   PROTEINUR NEGATIVE 04/24/2017 1532   UROBILINOGEN 0.2 06/11/2010 2321   NITRITE NEGATIVE 04/24/2017 1532   LEUKOCYTESUR SMALL (A) 04/24/2017 1532    Creatinine Clearance: Estimated Creatinine Clearance: 28.1 mL/min (A) (by C-G formula based on SCr of 1.08 mg/dL (H)).  Sepsis Labs: @LABRCNTIP (procalcitonin:4,lacticidven:4) )No results found for this or any previous visit (from the past 240 hour(s)).   Radiological Exams on Admission: Ct Abdomen Pelvis W Contrast  Result Date: 04/24/2017 CLINICAL DATA:  Abdominal pain EXAM: CT ABDOMEN AND  PELVIS WITH CONTRAST TECHNIQUE: Multidetector CT imaging of the abdomen and pelvis was performed using the standard protocol following bolus administration of intravenous contrast. CONTRAST:  100 mL Isovue-300 COMPARISON:  None. FINDINGS: Lower chest: No pulmonary nodules or pleural effusion. No visible pericardial effusion. Hepatobiliary: There is hepatic steatosis. No focal liver lesion. No biliary dilatation. Normal gallbladder. Pancreas: There is diffuse pancreatic ductal dilatation, measuring up to 7 mm. No focal lesion identified. Spleen: Normal. Adrenals/Urinary Tract: --Adrenal glands: Normal. --Right kidney/ureter: No hydronephrosis or perinephric stranding. No nephrolithiasis. No obstructing ureteral stones. 2.5 cm right renal cyst. --Left kidney/ureter: No hydronephrosis or perinephric stranding. No nephrolithiasis. No obstructing ureteral stones. --Urinary bladder: Unremarkable. Stomach/Bowel: --Stomach/Duodenum: No hiatal hernia or other gastric abnormality. Normal duodenal course and caliber. --Small bowel: No dilatation or inflammation. --Colon: There is wall  thickening and surrounding inflammation of the distal transverse colon, descending colon and sigmoid colon. There is sigmoid diverticulosis. --Appendix: Normal. Vascular/Lymphatic: There is atherosclerotic calcification of the non aneurysmal abdominal aorta. No abdominal or pelvic lymphadenopathy. Reproductive: Small amount of free fluid in the pelvis. Uterus is normal. No adnexal mass. Musculoskeletal. Multilevel degenerative disc disease and facet arthrosis. No bony spinal canal stenosis. Other: Small amounts of fluid adjacent to the descending colon. IMPRESSION: 1. Long segment inflammation of the colon extending from the distal transverse colon to the sigmoid colon. Small amount of surrounding free fluid but no abscess or drainable collection. Findings suggest an infectious or inflammatory colitis. 2. Diffuse dilatation of the pancreatic duct, measuring up to 7 mm. No obstructing lesion identified. MRI/MRCP of the abdomen with and without contrast is recommended to assess for possible ampullary mass. 3.  Aortic Atherosclerosis (ICD10-I70.0). Electronically Signed   By: Ulyses Jarred M.D.   On: 04/24/2017 15:23   Portable Chest 1 View  Result Date: 04/24/2017 CLINICAL DATA:  83 year old presenting with a near syncopal episode earlier today, now with generalized weakness. EXAM: PORTABLE CHEST 1 VIEW COMPARISON:  07/25/2007. FINDINGS: Cardiac silhouette mildly enlarged for AP portable technique. Thoracic aorta atherosclerotic. Hilar and mediastinal contours otherwise unremarkable. Mild linear atelectasis at the left lung base. Lungs otherwise clear. Pulmonary vascularity normal. No pleural effusions. IMPRESSION: 1. Linear atelectasis involving the left lung base. No acute cardiopulmonary disease otherwise. 2.  Aortic Atherosclerosis (ICD10-170.0) Electronically Signed   By: Evangeline Dakin M.D.   On: 04/24/2017 16:55    EKG: Independently reviewed. Sinus rhythm Left anterior fascicular block LVH with  secondary repolarization abnormality Anterior Q waves, possibly due to LVH PVCs new otherwise no acute changes   Assessment/Plan Active Problems:   Syncope   Colitis   New onset a-fib ([facility])   Elevated troponin   Hyperglycemia   #1. Syncope. Likely secondary to hypotension related to dehydration as a result of large bowel movement and decreased oral intake in the setting of abdominal pain. Reportedly when EMS arrived to her house systolic blood pressure was 86. No signs of infectious process. No metabolic derangement. EKG as noted above. Initial troponin 0.07. She was provided with a 500 mL bolus of normal saline. During her stay in the emergency department blood pressure is at the high end of normal. Patient converted to A. fib with RVR. She was provided with 5 mg of Cardizem and at the time of admission she was in A. fib rate controlled. Orthostatic vital signs not documented -Admit to telemetry -Obtain orthostatic vital signs -Normal saline bolus of 500 mL -Gentle IV fluids continuously -Obtain a TSH -Obtain a chest x-ray -Obtain  a 2-D echo  #2. New onset A. Fib/mildly elevated troponin. Initially with rapid ventricular response. Likely related to dehydration. No history of same. She is provided with 5 mg of Cardizem in the emergency department. EKG as noted above. No chest pain but patient did endorse palpitations At the time of admission she still in A. fib rate is controlled. -Monitor on telemetry -Provide scheduled beta blocker with parameters -continue low dose aspirine -Get a repeat EKG now -EKG in the morning -IV fluids as noted above -May benefit from outpatient cardiology consult -not likely candidate for anti-coagulation  #3. Colitis/abdominal pain. Pain much improved on admission. CT findings suggest infectious or inflammatory colitis. No leukocytosis she's afebrile nontoxic appearing. Has had some loose stool -Bowel rest -Flagyl -Clear liquids -Monitor  #4.  Hyperglycemia. No history of diabetes. May be related to infection -Obtain a hemoglobin A1c -Sliding scale for optimal control -Hopefully will resolve itself once infection clears up -Clear liquid diet  #5. Hypertension. Blood pressure high end of normal while in the emergency department. Home medications include Norvasc. -Continue home meds -Beta blocker as noted above -Monitor   DVT prophylaxis: lovenox  Code Status: full (confirmed with patient)  Family Communication: son at bedside  Disposition Plan: home  Consults called: none  Admission status: obs    Dyanne Carrel M MD [facility]  If 7PM-7AM, please contact night-coverage [email or web address] Password [email or web address]  04/24/2017, 5:05 PM

## 2017-04-25 ENCOUNTER — Observation Stay (HOSPITAL_BASED_OUTPATIENT_CLINIC_OR_DEPARTMENT_OTHER): Payer: Medicare Other

## 2017-04-25 ENCOUNTER — Encounter (HOSPITAL_COMMUNITY): Payer: Self-pay | Admitting: General Practice

## 2017-04-25 DIAGNOSIS — K8689 Other specified diseases of pancreas: Secondary | ICD-10-CM | POA: Diagnosis not present

## 2017-04-25 DIAGNOSIS — I444 Left anterior fascicular block: Secondary | ICD-10-CM | POA: Diagnosis not present

## 2017-04-25 DIAGNOSIS — Z833 Family history of diabetes mellitus: Secondary | ICD-10-CM | POA: Diagnosis not present

## 2017-04-25 DIAGNOSIS — I4891 Unspecified atrial fibrillation: Secondary | ICD-10-CM | POA: Diagnosis not present

## 2017-04-25 DIAGNOSIS — I5032 Chronic diastolic (congestive) heart failure: Secondary | ICD-10-CM | POA: Diagnosis not present

## 2017-04-25 DIAGNOSIS — R55 Syncope and collapse: Secondary | ICD-10-CM | POA: Diagnosis not present

## 2017-04-25 DIAGNOSIS — I480 Paroxysmal atrial fibrillation: Secondary | ICD-10-CM | POA: Diagnosis not present

## 2017-04-25 DIAGNOSIS — R748 Abnormal levels of other serum enzymes: Secondary | ICD-10-CM | POA: Diagnosis not present

## 2017-04-25 DIAGNOSIS — E860 Dehydration: Secondary | ICD-10-CM | POA: Diagnosis not present

## 2017-04-25 DIAGNOSIS — I4892 Unspecified atrial flutter: Secondary | ICD-10-CM | POA: Diagnosis not present

## 2017-04-25 DIAGNOSIS — I10 Essential (primary) hypertension: Secondary | ICD-10-CM | POA: Diagnosis not present

## 2017-04-25 DIAGNOSIS — Z79899 Other long term (current) drug therapy: Secondary | ICD-10-CM | POA: Diagnosis not present

## 2017-04-25 DIAGNOSIS — Z7982 Long term (current) use of aspirin: Secondary | ICD-10-CM | POA: Diagnosis not present

## 2017-04-25 DIAGNOSIS — D638 Anemia in other chronic diseases classified elsewhere: Secondary | ICD-10-CM | POA: Diagnosis not present

## 2017-04-25 DIAGNOSIS — R739 Hyperglycemia, unspecified: Secondary | ICD-10-CM | POA: Diagnosis not present

## 2017-04-25 DIAGNOSIS — Z8249 Family history of ischemic heart disease and other diseases of the circulatory system: Secondary | ICD-10-CM | POA: Diagnosis not present

## 2017-04-25 DIAGNOSIS — D500 Iron deficiency anemia secondary to blood loss (chronic): Secondary | ICD-10-CM | POA: Diagnosis not present

## 2017-04-25 DIAGNOSIS — K529 Noninfective gastroenteritis and colitis, unspecified: Secondary | ICD-10-CM | POA: Diagnosis not present

## 2017-04-25 DIAGNOSIS — R195 Other fecal abnormalities: Secondary | ICD-10-CM | POA: Diagnosis not present

## 2017-04-25 LAB — HEMOGLOBIN AND HEMATOCRIT, BLOOD
HCT: 35.2 % — ABNORMAL LOW (ref 36.0–46.0)
Hemoglobin: 11.6 g/dL — ABNORMAL LOW (ref 12.0–15.0)

## 2017-04-25 LAB — BASIC METABOLIC PANEL
ANION GAP: 11 (ref 5–15)
BUN: 15 mg/dL (ref 6–20)
CALCIUM: 8.8 mg/dL — AB (ref 8.9–10.3)
CO2: 24 mmol/L (ref 22–32)
CREATININE: 0.97 mg/dL (ref 0.44–1.00)
Chloride: 101 mmol/L (ref 101–111)
GFR calc non Af Amer: 49 mL/min — ABNORMAL LOW (ref >60)
GFR, EST AFRICAN AMERICAN: 57 mL/min — AB (ref >60)
Glucose, Bld: 148 mg/dL — ABNORMAL HIGH (ref 65–99)
Potassium: 3.8 mmol/L (ref 3.5–5.1)
SODIUM: 136 mmol/L (ref 135–145)

## 2017-04-25 LAB — GLUCOSE, CAPILLARY
GLUCOSE-CAPILLARY: 121 mg/dL — AB (ref 65–99)
GLUCOSE-CAPILLARY: 134 mg/dL — AB (ref 65–99)
Glucose-Capillary: 96 mg/dL (ref 65–99)
Glucose-Capillary: 139 mg/dL — ABNORMAL HIGH (ref 65–99)

## 2017-04-25 LAB — CBC
HCT: 40.8 % (ref 36.0–46.0)
HEMOGLOBIN: 12.9 g/dL (ref 12.0–15.0)
MCH: 26 pg (ref 26.0–34.0)
MCHC: 31.6 g/dL (ref 30.0–36.0)
MCV: 82.3 fL (ref 78.0–100.0)
PLATELETS: 224 10*3/uL (ref 150–400)
RBC: 4.96 MIL/uL (ref 3.87–5.11)
RDW: 13.6 % (ref 11.5–15.5)
WBC: 15 10*3/uL — AB (ref 4.0–10.5)

## 2017-04-25 LAB — ECHOCARDIOGRAM COMPLETE
HEIGHTINCHES: 63 in
WEIGHTICAEL: 2084.67 [oz_av]

## 2017-04-25 LAB — TROPONIN I: TROPONIN I: 0.08 ng/mL — AB (ref <0.03)

## 2017-04-25 LAB — OCCULT BLOOD X 1 CARD TO LAB, STOOL: Fecal Occult Bld: POSITIVE — AB

## 2017-04-25 MED ORDER — METOPROLOL TARTRATE 25 MG PO TABS
25.0000 mg | ORAL_TABLET | Freq: Two times a day (BID) | ORAL | Status: DC
  Administered 2017-04-25 – 2017-04-29 (×9): 25 mg via ORAL
  Filled 2017-04-25 (×9): qty 1

## 2017-04-25 MED ORDER — SODIUM CHLORIDE 0.9 % IV SOLN: INTRAVENOUS | Status: AC

## 2017-04-25 MED ORDER — METRONIDAZOLE IN NACL 5-0.79 MG/ML-% IV SOLN
500.0000 mg | Freq: Three times a day (TID) | INTRAVENOUS | Status: DC
  Administered 2017-04-25 – 2017-04-29 (×12): 500 mg via INTRAVENOUS
  Filled 2017-04-25 (×15): qty 100

## 2017-04-25 MED ORDER — FAMOTIDINE IN NACL 20-0.9 MG/50ML-% IV SOLN
20.0000 mg | Freq: Once | INTRAVENOUS | Status: AC
  Administered 2017-04-26: 20 mg via INTRAVENOUS
  Filled 2017-04-25: qty 50

## 2017-04-25 NOTE — Progress Notes (Signed)
Pt converted to NSR, confirmed w/ EKG. Notified Provider on call. Continue Metoprolol  Q6 w/ parameters

## 2017-04-25 NOTE — Progress Notes (Addendum)
PROGRESS NOTE    Amanda Rocha  OYD:[id number] DOB: 09-02-1921 DOA: 04/24/2017 PCP: [place] Chard, MD   Outpatient Specialists:     Brief Narrative:  Amanda Rocha is a delightful 83 y.o. female with medical history significant for hypertension presents to the emergency department with the chief complaint of palpitations and syncope. Initial evaluation in th ER was thought to reveal A. fib with RVR likely related to dehydration as a result of diarrhea, has since converted to sinus.    Assessment & Plan:   Active Problems:   Syncope   Colitis   New onset a-fib (H[facility])   Elevated troponin   Hyperglycemia   Syncope.  -Likely secondary to hypotension related to dehydration -Reportedly when EMS arrived to her house systolic blood pressure was 86.  -Gentle IV fluids continuously as unable to keep up with oral intake -Obtain a 2-D echo -TSH normal  Maroon stool -recheck H/H -d/c ASA Place SCD -hold on diet advancement  New a flutter -found on tele strip from 7/31 at 8:44 PM -converted back to NSR -on BB -TSH normal -echo pending -not sure patient would be candidate for anticoagulation--CHAD VASC 2 of at least 4 -PT eval and then consideration of blood thinner  Elevated troponin -?related to elevated HR  Colitis/abdominal pain. -CT findings suggest infectious or inflammatory colitis  -Flagyl -fecal lactoferrin ordered and is pending -patient improved- if worsens, consider CTA of abd as patient had episode of fib/flutter -check GI pathogen panel   Hyperglycemia. No history of diabetes. May be related to infection -SSI- monitor   Hypertension.  Continue home meds Added BB  Diffuse dilatation of the pancreatic duct, measuring up to 7 mm. No obstructing lesion identified. MRI/MRCP of the abdomen with and without contrast is recommended to assess for possible ampullary mass- defer to PCP as patient is 83 yo  DVT prophylaxis:  Lovenox   Code  Status: Full Code   Family Communication:   Disposition Plan:     Consultants:   PT   Subjective: Stomach pain is better  Objective: Vitals:   04/24/17 2021 04/24/17 2300 04/25/17 0140 04/25/17 0248  BP: (!) 156/79 (!) 143/59 (!) 132/58 (!) 142/50  Pulse: 66 68 69 70  Resp: 15 16  17   Temp: 98.8 F (37.1 C)     TempSrc: Oral     SpO2: 97%  96%   Weight: 59.1 kg (130 lb 4.7 oz)     Height: 5\' 3"  (1.6 m)       Intake/Output Summary (Last 24 hours) at 04/25/17 1328 Last data filed at 04/25/17 0959  Gross per 24 hour  Intake           1542.5 ml  Output                0 ml  Net           1542.5 ml   Filed Weights   04/24/17 1252 04/24/17 2021  Weight: 61.2 kg (135 lb) 59.1 kg (130 lb 4.7 oz)    Examination:  General exam: Appears calm and comfortable  Respiratory system: Clear to auscultation. Respiratory effort normal. Cardiovascular system: S1 & S2 heard, RRR. No JVD, murmurs, rubs, gallops or clicks. No pedal edema. Gastrointestinal system: Abdomen is nondistended, soft and nontender. No organomegaly or masses felt. Normal bowel sounds heard. Central nervous system: Alert and oriented. No focal neurological deficits. Extremities: Symmetric 5 x 5 power. Skin: No rashes, lesions or ulcers Psychiatry: Judgement and insight appear  normal. Mood & affect appropriate.     Data Reviewed: I have personally reviewed following labs and imaging studies  CBC:  Recent Labs Lab 04/24/17 1310 04/25/17 0300  WBC 10.1 15.0*  HGB 13.4 12.9  HCT 43.7 40.8  MCV 84.2 82.3  PLT 245 275   Basic Metabolic Panel:  Recent Labs Lab 04/24/17 1310 04/25/17 0300  NA 135 136  K 3.4* 3.8  CL 99* 101  CO2 23 24  GLUCOSE 223* 148*  BUN 16 15  CREATININE 1.08* 0.97  CALCIUM 9.7 8.8*   GFR: Estimated Creatinine Clearance: 30 mL/min (by C-G formula based on SCr of 0.97 mg/dL). Liver Function Tests:  Recent Labs Lab 04/24/17 1338  AST 29  ALT 10*  ALKPHOS 70   BILITOT 0.6  PROT 6.7  ALBUMIN 3.7    Recent Labs Lab 04/24/17 1338  LIPASE 24   No results for input(s): AMMONIA in the last 168 hours. Coagulation Profile: No results for input(s): INR, PROTIME in the last 168 hours. Cardiac Enzymes:  Recent Labs Lab 04/24/17 1633 04/25/17 0849  TROPONINI 0.12* 0.08*   BNP (last 3 results) No results for input(s): PROBNP in the last 8760 hours. HbA1C: No results for input(s): HGBA1C in the last 72 hours. CBG:  Recent Labs Lab 04/24/17 1928 04/25/17 0737 04/25/17 1133  GLUCAP 177* 121* 134*   Lipid Profile: No results for input(s): CHOL, HDL, LDLCALC, TRIG, CHOLHDL, LDLDIRECT in the last 72 hours. Thyroid Function Tests:  Recent Labs  04/24/17 2036  TSH 0.446   Anemia Panel: No results for input(s): VITAMINB12, FOLATE, FERRITIN, TIBC, IRON, RETICCTPCT in the last 72 hours. Urine analysis:    Component Value Date/Time   COLORURINE YELLOW 04/24/2017 1532   APPEARANCEUR HAZY (A) 04/24/2017 1532   LABSPEC 1.016 04/24/2017 1532   PHURINE 6.0 04/24/2017 1532   GLUCOSEU 50 (A) 04/24/2017 1532   HGBUR MODERATE (A) 04/24/2017 1532   BILIRUBINUR NEGATIVE 04/24/2017 1532   KETONESUR NEGATIVE 04/24/2017 1532   PROTEINUR NEGATIVE 04/24/2017 1532   UROBILINOGEN 0.2 06/11/2010 2321   NITRITE NEGATIVE 04/24/2017 1532   LEUKOCYTESUR SMALL (A) 04/24/2017 1532     )No results found for this or any previous visit (from the past 240 hour(s)).    Anti-infectives    Start     Dose/Rate Route Frequency Ordered Stop   04/25/17 1400  metroNIDAZOLE (FLAGYL) IVPB 500 mg     500 mg 100 mL/hr over 60 Minutes Intravenous Every 8 hours 04/25/17 1150     04/24/17 1800  metroNIDAZOLE (FLAGYL) IVPB 500 mg  Status:  Discontinued     500 mg 100 mL/hr over 60 Minutes Intravenous Every 8 hours 04/24/17 1608 04/25/17 1150       Radiology Studies: Ct Abdomen Pelvis W Contrast  Result Date: 04/24/2017 CLINICAL DATA:  Abdominal pain EXAM: CT  ABDOMEN AND PELVIS WITH CONTRAST TECHNIQUE: Multidetector CT imaging of the abdomen and pelvis was performed using the standard protocol following bolus administration of intravenous contrast. CONTRAST:  100 mL Isovue-300 COMPARISON:  None. FINDINGS: Lower chest: No pulmonary nodules or pleural effusion. No visible pericardial effusion. Hepatobiliary: There is hepatic steatosis. No focal liver lesion. No biliary dilatation. Normal gallbladder. Pancreas: There is diffuse pancreatic ductal dilatation, measuring up to 7 mm. No focal lesion identified. Spleen: Normal. Adrenals/Urinary Tract: --Adrenal glands: Normal. --Right kidney/ureter: No hydronephrosis or perinephric stranding. No nephrolithiasis. No obstructing ureteral stones. 2.5 cm right renal cyst. --Left kidney/ureter: No hydronephrosis or perinephric stranding. No nephrolithiasis.  No obstructing ureteral stones. --Urinary bladder: Unremarkable. Stomach/Bowel: --Stomach/Duodenum: No hiatal hernia or other gastric abnormality. Normal duodenal course and caliber. --Small bowel: No dilatation or inflammation. --Colon: There is wall thickening and surrounding inflammation of the distal transverse colon, descending colon and sigmoid colon. There is sigmoid diverticulosis. --Appendix: Normal. Vascular/Lymphatic: There is atherosclerotic calcification of the non aneurysmal abdominal aorta. No abdominal or pelvic lymphadenopathy. Reproductive: Small amount of free fluid in the pelvis. Uterus is normal. No adnexal mass. Musculoskeletal. Multilevel degenerative disc disease and facet arthrosis. No bony spinal canal stenosis. Other: Small amounts of fluid adjacent to the descending colon. IMPRESSION: 1. Long segment inflammation of the colon extending from the distal transverse colon to the sigmoid colon. Small amount of surrounding free fluid but no abscess or drainable collection. Findings suggest an infectious or inflammatory colitis. 2. Diffuse dilatation of the  pancreatic duct, measuring up to 7 mm. No obstructing lesion identified. MRI/MRCP of the abdomen with and without contrast is recommended to assess for possible ampullary mass. 3.  Aortic Atherosclerosis (ICD10-I70.0). Electronically Signed   By: Ulyses Jarred M.D.   On: 04/24/2017 15:23   Portable Chest 1 View  Result Date: 04/24/2017 CLINICAL DATA:  83 year old presenting with a near syncopal episode earlier today, now with generalized weakness. EXAM: PORTABLE CHEST 1 VIEW COMPARISON:  07/25/2007. FINDINGS: Cardiac silhouette mildly enlarged for AP portable technique. Thoracic aorta atherosclerotic. Hilar and mediastinal contours otherwise unremarkable. Mild linear atelectasis at the left lung base. Lungs otherwise clear. Pulmonary vascularity normal. No pleural effusions. IMPRESSION: 1. Linear atelectasis involving the left lung base. No acute cardiopulmonary disease otherwise. 2.  Aortic Atherosclerosis (ICD10-170.0) Electronically Signed   By: Evangeline Dakin M.D.   On: 04/24/2017 16:55        Scheduled Meds: . amLODipine  5 mg Oral Daily  . aspirin EC  81 mg Oral Daily  . enoxaparin (LOVENOX) injection  30 mg Subcutaneous Q24H  . insulin aspart  0-15 Units Subcutaneous TID WC  . metoprolol tartrate  25 mg Oral BID  . sodium chloride flush  3 mL Intravenous Q12H   Continuous Infusions: . metronidazole       LOS: 0 days    Time spent: 35 min    [place], DO [facility] Pager [phone]  If 7PM-7AM, please contact night-coverage [email or web address] Password [email or web address] 04/25/2017, 1:28 PM

## 2017-04-25 NOTE — Plan of Care (Signed)
Problem: Safety: Goal: Ability to remain free from injury will improve Outcome: Progressing Pt free from injury. Orthostatic VS assessed. Denies any dizziness or syncope. Bed alarm on. Pt A/OX4. Educated on use of call system and need to call for assistance.   Problem: Pain Managment: Goal: General experience of comfort will improve Outcome: Progressing Pt c/o some abd pain early in shift. Responded well to pain medication. Currently denies pain. Continue to monitor.  Problem: Tissue Perfusion: Goal: Risk factors for ineffective tissue perfusion will decrease Outcome: Progressing Pt converted to NSR. EKG obtained. VSS. Medications administered as ordered. Pt denies any CP/SOB/Dizziness. Continue to monitor.  Problem: Bowel/Gastric: Goal: Will not experience complications related to bowel motility Outcome: Progressing Pt had some N/V at start of shift. Zofran administered. Pt responded well to treatment. Currently denies any symptoms. No BM. Continue to monitor.

## 2017-04-25 NOTE — Plan of Care (Signed)
Problem: Cardiac: Goal: Ability to achieve and maintain adequate cardiopulmonary perfusion will improve Outcome: Progressing Pt is maintaining SR

## 2017-04-25 NOTE — Progress Notes (Signed)
PT Cancellation Note  Patient Details Name: Amanda Rocha MRN: [id number] DOB: [birth year]   Cancelled Treatment:    Reason Eval/Treat Not Completed: Pain limiting ability to participate.  Attempted to see patient x2 today.  Family member reporting patient with pain, asking for pain meds.  Contacted RN.  PT will return tomorrow for evaluation.   [name] 04/25/2017, 8:14 PM Carita Pian. Sanjuana Kava, [place] Pager [phone]

## 2017-04-25 NOTE — Progress Notes (Signed)
Notified Dr. Eliseo Squires of maroon colored stools. Cont to monitor. Carroll Kinds RN

## 2017-04-25 NOTE — Plan of Care (Signed)
Problem: Bowel/Gastric: Goal: Will not experience complications related to bowel motility Outcome: Progressing Pt has not had anymore episodes of diarrhea

## 2017-04-26 DIAGNOSIS — I4891 Unspecified atrial fibrillation: Secondary | ICD-10-CM

## 2017-04-26 DIAGNOSIS — K529 Noninfective gastroenteritis and colitis, unspecified: Secondary | ICD-10-CM

## 2017-04-26 DIAGNOSIS — I10 Essential (primary) hypertension: Secondary | ICD-10-CM

## 2017-04-26 LAB — FERRITIN: Ferritin: 135 ng/mL (ref 11–307)

## 2017-04-26 LAB — CBC
HEMATOCRIT: 32.1 % — AB (ref 36.0–46.0)
Hemoglobin: 10.4 g/dL — ABNORMAL LOW (ref 12.0–15.0)
MCH: 26.7 pg (ref 26.0–34.0)
MCHC: 32.4 g/dL (ref 30.0–36.0)
MCV: 82.3 fL (ref 78.0–100.0)
Platelets: 190 10*3/uL (ref 150–400)
RBC: 3.9 MIL/uL (ref 3.87–5.11)
RDW: 14.3 % (ref 11.5–15.5)
WBC: 12.3 10*3/uL — ABNORMAL HIGH (ref 4.0–10.5)

## 2017-04-26 LAB — BASIC METABOLIC PANEL
Anion gap: 13 (ref 5–15)
BUN: 13 mg/dL (ref 6–20)
CALCIUM: 8.8 mg/dL — AB (ref 8.9–10.3)
CO2: 25 mmol/L (ref 22–32)
CREATININE: 0.79 mg/dL (ref 0.44–1.00)
Chloride: 107 mmol/L (ref 101–111)
GFR calc non Af Amer: >60 mL/min (ref >60)
Glucose, Bld: 136 mg/dL — ABNORMAL HIGH (ref 65–99)
Potassium: 3.6 mmol/L (ref 3.5–5.1)
Sodium: 145 mmol/L (ref 135–145)

## 2017-04-26 LAB — IRON AND TIBC
Iron: 28 ug/dL (ref 28–170)
SATURATION RATIOS: 10 % — AB (ref 10.4–31.8)
TIBC: 280 ug/dL (ref 250–450)
UIBC: 252 ug/dL

## 2017-04-26 LAB — GLUCOSE, CAPILLARY
Glucose-Capillary: 110 mg/dL — ABNORMAL HIGH (ref 65–99)
Glucose-Capillary: 163 mg/dL — ABNORMAL HIGH (ref 65–99)

## 2017-04-26 LAB — FOLATE: Folate: 18.5 ng/mL (ref >5.9)

## 2017-04-26 LAB — RETICULOCYTES
RBC.: 4.38 MIL/uL (ref 3.87–5.11)
RETIC CT PCT: 1.1 % (ref 0.4–3.1)
Retic Count, Absolute: 48.2 10*3/uL (ref 19.0–186.0)

## 2017-04-26 LAB — LACTOFERRIN, FECAL, QUALITATIVE: LACTOFERRIN, FECAL, QUAL: POSITIVE — AB

## 2017-04-26 LAB — VITAMIN B12: VITAMIN B 12: 486 pg/mL (ref 180–914)

## 2017-04-26 MED ORDER — CIPROFLOXACIN IN D5W 400 MG/200ML IV SOLN
400.0000 mg | Freq: Two times a day (BID) | INTRAVENOUS | Status: DC
  Administered 2017-04-26: 400 mg via INTRAVENOUS
  Filled 2017-04-26 (×2): qty 200

## 2017-04-26 MED ORDER — PANTOPRAZOLE SODIUM 40 MG IV SOLR
40.0000 mg | INTRAVENOUS | Status: DC
  Administered 2017-04-26 – 2017-04-28 (×3): 40 mg via INTRAVENOUS
  Filled 2017-04-26 (×3): qty 40

## 2017-04-26 MED ORDER — CIPROFLOXACIN IN D5W 400 MG/200ML IV SOLN
400.0000 mg | Freq: Two times a day (BID) | INTRAVENOUS | Status: DC
  Administered 2017-04-27: 400 mg via INTRAVENOUS
  Filled 2017-04-26: qty 200

## 2017-04-26 NOTE — Progress Notes (Signed)
PROGRESS NOTE    Amanda Rocha  KXF:[id number] DOB: 06-Jan-1921 DOA: 04/24/2017 PCP: Glendale Chard, MD    Brief Narrative: 83 year old lady with prior h/o of paroxysmal a fib, hypertension, syncope, presents with dizziness, palpitations, diarrhea and dehydration. She was found to bein afib with RVR from dehydration and infectious vs inflammatory colitis.   Assessment & Plan:   Active Problems:   Syncope   Colitis   New onset a-fib (HCC)   Elevated troponin   Hyperglycemia  Paroxysmal atrial fibrillation:  In sinus and rate controlled.  TSH wnl.  Echocardiogram unremarkable.  Probably brought on by dehydration.    Dehydration probably secondary to diarrhea from colitis.  Improved with IV fluids.    Infectious vs inflammatory colitis.  Pt reports being constipated and used senna leaves with tea to help with constipation, following that she had multiple episodes of nausea, vomiting and diarrhea , abdominal pain. CT showed evidence of colitis in the transverse colon.  Hemoccult was positive. Pt denies ever having Colonoscopy, and refuses to have any invasive procedures at this time.  Currently on IV flagyl, Ciprofloxacin added as she continues to have pain late in the evenings.    Hypertension: controlled.  Resume norvasc.    Incidental finding of Diffuse dilatation of the pancreatic duct, measuring up to 7 mm. No obstructing lesion identified. MRI/MRCP of the abdomen with and without contrast is recommended to assess for possible ampullary Mass. Discussed with the patient, she wanted to talk to the family before proceeding.    Anemia. Anemia of chronic disease vs anemia of blood loss from colitis.  Hemoccult positive.  Anemia panel will be added.    Leukocytosis:  Improving  Dizziness and palpitations: resolved.   DVT prophylaxis: (Lovenox) Code Status: (Full) Family Communication: (none at bedside) Disposition Plan: (home in 1 to 2 days). Pending PT  evaluation.    Consultants:   None.    Procedures: CT abd and pelvis  Echocardiogram.   MRI of the abdomen.    Antimicrobials: ciprofloxacin and flagyl    Subjective: Reports having abd pain last night. None this am.  No nausea or vomiting.   Objective: Vitals:   04/25/17 2301 04/26/17 0107 04/26/17 0252 04/26/17 0500  BP: (!) 174/67 (!) 175/65 (!) 141/59 (!) 146/61  Pulse: 75  67 60  Resp:  16 16 18   Temp:    98.7 F (37.1 C)  TempSrc:    Oral  SpO2:   99% 99%  Weight:   59 kg (130 lb)   Height:        Intake/Output Summary (Last 24 hours) at 04/26/17 1120 Last data filed at 04/26/17 0400  Gross per 24 hour  Intake             1925 ml  Output              500 ml  Net             1425 ml   Filed Weights   04/24/17 1252 04/24/17 2021 04/26/17 0252  Weight: 61.2 kg (135 lb) 59.1 kg (130 lb 4.7 oz) 59 kg (130 lb)    Examination:  General exam: Appears calm and comfortable  Respiratory system: Clear to auscultation. Respiratory effort normal. Cardiovascular system: S1 & S2 heard, RRR. No JVD, murmurs, rubs, gallops or clicks. No pedal edema. Gastrointestinal system: Abdomen is SOFT mildly tender in the epigastric area, non distended bowel.  Central nervous system: Alert and oriented. No focal  neurological deficits. Extremities: Symmetric 5 x 5 power. Skin: No rashes, lesions or ulcers Psychiatry: Judgement and insight appear normal. Mood & affect appropriate.     Data Reviewed: I have personally reviewed following labs and imaging studies  CBC:  Recent Labs Lab 04/24/17 1310 04/25/17 0300 04/25/17 1829 04/26/17 0315  WBC 10.1 15.0*  --  12.3*  HGB 13.4 12.9 11.6* 10.4*  HCT 43.7 40.8 35.2* 32.1*  MCV 84.2 82.3  --  82.3  PLT 245 224  --  712   Basic Metabolic Panel:  Recent Labs Lab 04/24/17 1310 04/25/17 0300 04/26/17 0315  NA 135 136 145  K 3.4* 3.8 3.6  CL 99* 101 107  CO2 23 24 25   GLUCOSE 223* 148* 136*  BUN 16 15 13     CREATININE 1.08* 0.97 0.79  CALCIUM 9.7 8.8* 8.8*   GFR: Estimated Creatinine Clearance: 36.3 mL/min (by C-G formula based on SCr of 0.79 mg/dL). Liver Function Tests:  Recent Labs Lab 04/24/17 1338  AST 29  ALT 10*  ALKPHOS 70  BILITOT 0.6  PROT 6.7  ALBUMIN 3.7    Recent Labs Lab 04/24/17 1338  LIPASE 24   No results for input(s): AMMONIA in the last 168 hours. Coagulation Profile: No results for input(s): INR, PROTIME in the last 168 hours. Cardiac Enzymes:  Recent Labs Lab 04/24/17 1633 04/25/17 0849  TROPONINI 0.12* 0.08*   BNP (last 3 results) No results for input(s): PROBNP in the last 8760 hours. HbA1C: No results for input(s): HGBA1C in the last 72 hours. CBG:  Recent Labs Lab 04/25/17 0737 04/25/17 1133 04/25/17 1653 04/25/17 2207 04/26/17 0755  GLUCAP 121* 134* 96 139* 110*   Lipid Profile: No results for input(s): CHOL, HDL, LDLCALC, TRIG, CHOLHDL, LDLDIRECT in the last 72 hours. Thyroid Function Tests:  Recent Labs  04/24/17 2036  TSH 0.446   Anemia Panel:  Recent Labs  04/26/17 1028  RETICCTPCT 1.1   Sepsis Labs: No results for input(s): PROCALCITON, LATICACIDVEN in the last 168 hours.  No results found for this or any previous visit (from the past 240 hour(s)).       Radiology Studies: Ct Abdomen Pelvis W Contrast  Result Date: 04/24/2017 CLINICAL DATA:  Abdominal pain EXAM: CT ABDOMEN AND PELVIS WITH CONTRAST TECHNIQUE: Multidetector CT imaging of the abdomen and pelvis was performed using the standard protocol following bolus administration of intravenous contrast. CONTRAST:  100 mL Isovue-300 COMPARISON:  None. FINDINGS: Lower chest: No pulmonary nodules or pleural effusion. No visible pericardial effusion. Hepatobiliary: There is hepatic steatosis. No focal liver lesion. No biliary dilatation. Normal gallbladder. Pancreas: There is diffuse pancreatic ductal dilatation, measuring up to 7 mm. No focal lesion identified.  Spleen: Normal. Adrenals/Urinary Tract: --Adrenal glands: Normal. --Right kidney/ureter: No hydronephrosis or perinephric stranding. No nephrolithiasis. No obstructing ureteral stones. 2.5 cm right renal cyst. --Left kidney/ureter: No hydronephrosis or perinephric stranding. No nephrolithiasis. No obstructing ureteral stones. --Urinary bladder: Unremarkable. Stomach/Bowel: --Stomach/Duodenum: No hiatal hernia or other gastric abnormality. Normal duodenal course and caliber. --Small bowel: No dilatation or inflammation. --Colon: There is wall thickening and surrounding inflammation of the distal transverse colon, descending colon and sigmoid colon. There is sigmoid diverticulosis. --Appendix: Normal. Vascular/Lymphatic: There is atherosclerotic calcification of the non aneurysmal abdominal aorta. No abdominal or pelvic lymphadenopathy. Reproductive: Small amount of free fluid in the pelvis. Uterus is normal. No adnexal mass. Musculoskeletal. Multilevel degenerative disc disease and facet arthrosis. No bony spinal canal stenosis. Other: Small amounts of fluid adjacent  to the descending colon. IMPRESSION: 1. Long segment inflammation of the colon extending from the distal transverse colon to the sigmoid colon. Small amount of surrounding free fluid but no abscess or drainable collection. Findings suggest an infectious or inflammatory colitis. 2. Diffuse dilatation of the pancreatic duct, measuring up to 7 mm. No obstructing lesion identified. MRI/MRCP of the abdomen with and without contrast is recommended to assess for possible ampullary mass. 3.  Aortic Atherosclerosis (ICD10-I70.0). Electronically Signed   By: Ulyses Jarred M.D.   On: 04/24/2017 15:23   Portable Chest 1 View  Result Date: 04/24/2017 CLINICAL DATA:  83 year old presenting with a near syncopal episode earlier today, now with generalized weakness. EXAM: PORTABLE CHEST 1 VIEW COMPARISON:  07/25/2007. FINDINGS: Cardiac silhouette mildly enlarged for  AP portable technique. Thoracic aorta atherosclerotic. Hilar and mediastinal contours otherwise unremarkable. Mild linear atelectasis at the left lung base. Lungs otherwise clear. Pulmonary vascularity normal. No pleural effusions. IMPRESSION: 1. Linear atelectasis involving the left lung base. No acute cardiopulmonary disease otherwise. 2.  Aortic Atherosclerosis (ICD10-170.0) Electronically Signed   By: Evangeline Dakin M.D.   On: 04/24/2017 16:55        Scheduled Meds: . amLODipine  5 mg Oral Daily  . enoxaparin (LOVENOX) injection  30 mg Subcutaneous Q24H  . insulin aspart  0-15 Units Subcutaneous TID WC  . metoprolol tartrate  25 mg Oral BID  . sodium chloride flush  3 mL Intravenous Q12H   Continuous Infusions: . metronidazole Stopped (04/26/17 0720)     LOS: 1 day    Time spent:35 MINUTES.     Hosie Poisson, MD [facility] Pager [phone]  If 7PM-7AM, please contact night-coverage [email or web address] Password [email or web address] 04/26/2017, 11:20 AM

## 2017-04-26 NOTE — Evaluation (Signed)
Physical Therapy Evaluation Patient Details Name: Amanda Rocha MRN: [id number] DOB: April 23, 1921 Today's Date: 04/26/2017   History of Present Illness  83 yo female admitted with syncope, a fib with rvr. Hx of HTN, A fib  Clinical Impression  On eval, pt required Min guard assist for mobility. She walked ~125 feet with a straight cane. Intermittent unsteadiness noted. Pt c/o not feeling well-c/o abdominal discomfort. She presents with general weakness and impaired gait and balance. Recommend HHPT f/u. Will follow and progress activity as tolerated.     Follow Up Recommendations Home health PT;Supervision - Intermittent    Equipment Recommendations  None recommended by PT    Recommendations for Other Services       Precautions / Restrictions Precautions Precautions: Fall Restrictions Weight Bearing Restrictions: No      Mobility  Bed Mobility Overal bed mobility: Modified Independent                Transfers Overall transfer level: Needs assistance   Transfers: Sit to/from Stand Sit to Stand: Supervision         General transfer comment: for safety  Ambulation/Gait Ambulation/Gait assistance: Min guard Ambulation Distance (Feet): 125 Feet Assistive device: Straight cane Gait Pattern/deviations: Staggering left;Staggering right;Drifts right/left;Step-through pattern;Decreased stride length     General Gait Details: Slow gait speed. Intermittent unsteadiness noted, even with use of cane.   Stairs            Wheelchair Mobility    Modified Rankin (Stroke Patients Only)       Balance Overall balance assessment: Needs assistance           Standing balance-Leahy Scale: Fair                               Pertinent Vitals/Pain Pain Assessment: 0-10 Pain Score: 8  Pain Location: abdominal discomfort Pain Intervention(s): Monitored during session    Home Living Family/patient expects to be discharged to:: Private  residence Living Arrangements: Children Available Help at Discharge: Family Type of Home: House Home Access: Level entry     Home Layout: One level Home Equipment: Kasandra Knudsen - single point      Prior Function Level of Independence: Independent with assistive device(s)         Comments: uses cane PRN     Hand Dominance        Extremity/Trunk Assessment   Upper Extremity Assessment Upper Extremity Assessment: Generalized weakness    Lower Extremity Assessment Lower Extremity Assessment: Generalized weakness       Communication   Communication: No difficulties  Cognition Arousal/Alertness: Awake/alert Behavior During Therapy: WFL for tasks assessed/performed Overall Cognitive Status: Within Functional Limits for tasks assessed                                        General Comments      Exercises     Assessment/Plan    PT Assessment Patient needs continued PT services  PT Problem List Decreased strength;Decreased mobility;Decreased activity tolerance;Decreased balance;Decreased knowledge of use of DME       PT Treatment Interventions DME instruction;Gait training;Therapeutic activities;Therapeutic exercise;Patient/family education;Balance training;Functional mobility training    PT Goals (Current goals can be found in the Care Plan section)  Acute Rehab PT Goals Patient Stated Goal: to feel better PT Goal Formulation: With patient Time For Goal Achievement:  05/10/17 Potential to Achieve Goals: Good    Frequency Min 3X/week   Barriers to discharge        Co-evaluation               AM-PAC PT "6 Clicks" Daily Activity  Outcome Measure Difficulty turning over in bed (including adjusting bedclothes, sheets and blankets)?: None Difficulty moving from lying on back to sitting on the side of the bed? : None Difficulty sitting down on and standing up from a chair with arms (e.g., wheelchair, bedside commode, etc,.)?: A Little Help  needed moving to and from a bed to chair (including a wheelchair)?: A Little Help needed walking in hospital room?: A Little Help needed climbing 3-5 steps with a railing? : A Little 6 Click Score: 20    End of Session   Activity Tolerance: Patient tolerated treatment well Patient left: in bed;with call bell/phone within reach;with nursing/sitter in room   PT Visit Diagnosis: Muscle weakness (generalized) (M62.81);Difficulty in walking, not elsewhere classified (R26.2)    Time: 4401-0272 PT Time Calculation (min) (ACUTE ONLY): 8 min   Charges:   PT Evaluation $PT Eval Low Complexity: 1 Low     PT G Codes:          Weston Anna, MPT Pager: [phone]

## 2017-04-26 NOTE — Plan of Care (Signed)
Problem: Activity: Goal: Risk for activity intolerance will decrease Outcome: Progressing Pt ambulating in room without difficulty   

## 2017-04-26 NOTE — Progress Notes (Signed)
Pharmacy Antibiotic Note  Amanda Rocha is a 83 y.o. female with colitis .  Pharmacy has been consulted for cipro dosing (patient noted on flagyl). -WBC= 12.3, afeb, CrCl ~ 35  Plan: -Cipro 400mg  IV q12h -Will follow renal function and clinical progress   Height: 5\' 3"  (160 cm) Weight: 130 lb (59 kg) IBW/kg (Calculated) : 52.4  Temp (24hrs), Avg:98.9 F (37.2 C), Min:98.6 F (37 C), Max:99.5 F (37.5 C)   Recent Labs Lab 04/24/17 1310 04/25/17 0300 04/26/17 0315  WBC 10.1 15.0* 12.3*  CREATININE 1.08* 0.97 0.79    Estimated Creatinine Clearance: 36.3 mL/min (by C-G formula based on SCr of 0.79 mg/dL).    No Known Allergies  Antimicrobials this admission: 8/1 flagyl 8/2 cipro  Dose adjustments this admission:   Microbiology results: n/a  Thank you for allowing pharmacy to be a part of this patient's care.  Hildred Laser, Pharm D 04/26/2017 11:30 AM

## 2017-04-26 NOTE — Plan of Care (Signed)
Problem: Safety: Goal: Ability to remain free from injury will improve Outcome: Progressing Pt free from injury. Family at bedside. Pt having intermittent confusion/delierum. Bed alarm on. Family and pt educated.  Problem: Pain Managment: Goal: General experience of comfort will improve Outcome: Progressing Pt c/o intermittent abd pain/discomfort. Medication administered w/ some relief.  Problem: Physical Regulation: Goal: Ability to maintain clinical measurements within normal limits will improve Outcome: Progressing VSS throughout shift. Pt having some nausea. Reports "sick" feeling. No relief w/ Zofran. Notified provider. Pepcid administered w/ relief. Waiting on pt to stool to collect sample. Will continue to monitor.

## 2017-04-26 NOTE — Progress Notes (Signed)
Pt reports "sick feeling" in stomach and throat. No relief w/ zofran. Notified provider on call. New orders placed.

## 2017-04-27 ENCOUNTER — Inpatient Hospital Stay (HOSPITAL_COMMUNITY): Payer: Medicare Other

## 2017-04-27 DIAGNOSIS — R748 Abnormal levels of other serum enzymes: Secondary | ICD-10-CM

## 2017-04-27 LAB — GLUCOSE, CAPILLARY
GLUCOSE-CAPILLARY: 143 mg/dL — AB (ref 65–99)
Glucose-Capillary: 131 mg/dL — ABNORMAL HIGH (ref 65–99)
Glucose-Capillary: 159 mg/dL — ABNORMAL HIGH (ref 65–99)
Glucose-Capillary: 124 mg/dL — ABNORMAL HIGH (ref 65–99)

## 2017-04-27 MED ORDER — GADOBENATE DIMEGLUMINE 529 MG/ML IV SOLN
15.0000 mL | Freq: Once | INTRAVENOUS | Status: AC
  Administered 2017-04-27: 12 mL via INTRAVENOUS

## 2017-04-27 MED ORDER — HYDRALAZINE HCL 20 MG/ML IJ SOLN: 5.0000 mg | Freq: Four times a day (QID) | INTRAMUSCULAR | Status: DC | PRN

## 2017-04-27 MED ORDER — CIPROFLOXACIN IN D5W 400 MG/200ML IV SOLN
400.0000 mg | INTRAVENOUS | Status: DC
  Administered 2017-04-28 – 2017-04-29 (×2): 400 mg via INTRAVENOUS
  Filled 2017-04-27 (×3): qty 200

## 2017-04-27 NOTE — Plan of Care (Signed)
Problem: Physical Regulation: Goal: Ability to maintain clinical measurements within normal limits will improve Outcome: Progressing VSS throughout shift. Still c/o intermittent "sick" feeling in abd and throat. Prn medication administered. MRI completed. Pt A/OX 4 over night. No confusion. Family at bedside. Fall precautions in place. Continue to monitor.

## 2017-04-27 NOTE — Progress Notes (Signed)
PROGRESS NOTE    Amanda Rocha  ZOX:[id number] DOB: July 30, 1921 DOA: 04/24/2017 PCP: Glendale Chard, MD    Brief Narrative: 83 year old lady with prior h/o of paroxysmal a fib, hypertension, syncope, presents with dizziness, palpitations, diarrhea and dehydration. She was found to bein afib with RVR from dehydration and infectious vs inflammatory colitis.   Assessment & Plan:   Active Problems:   Syncope   Colitis   New onset a-fib (HCC)   Elevated troponin   Hyperglycemia  Paroxysmal atrial fibrillation:  In sinus and rate controlled.  TSH wnl.  Echocardiogram unremarkable.  Probably brought on by dehydration.  No new complaints.    Dehydration probably secondary to diarrhea from colitis.  Improved with IV fluids.    Infectious vs inflammatory colitis.  Pt reports being constipated and used senna leaves with tea to help with constipation, following that she had multiple episodes of nausea, vomiting and diarrhea , abdominal pain. CT showed evidence of colitis in the transverse colon.  Hemoccult was positive. Pt denies ever having Colonoscopy, and refuses to have any invasive procedures at this time.  Currently on IV flagyl, Ciprofloxacin added as she continues to have pain.  Started on clears and advance diet as tolerated.    Hypertension:  Suboptimally controlled.  Resume norvasc. Added hydralazine prn.    Incidental finding of Diffuse dilatation of the pancreatic duct, measuring up to 7 mm. No obstructing lesion identified. MRI/MRCP of the abdomen with and without contrast is recommended to assess for possible ampullary Mass. Pancreas divisum variant ductal anatomy. Mild dilatation of the pancreatic duct and duct ectasia of the pancreatic tail. Favor chronic benign changes of pancreas.   Anemia. Anemia of chronic disease vs anemia of blood loss from colitis.  Hemoccult positive.  Anemia panel showed normal ferritin and normal iron stores b12 is 486.  Continue  to monitor. .    Leukocytosis:  Improving, repeat CBC in am.   Dizziness and palpitations: resolved. PT evaluation ordered .  Recommended HOME health.   DVT prophylaxis: (Lovenox) Code Status: (Full) Family Communication: (none at bedside) Disposition Plan: (home in 1 to 2 days).  Consultants:   None.    Procedures: CT abd and pelvis  Echocardiogram.   MRI of the abdomen.    Antimicrobials: ciprofloxacin and flagyl    Subjective: No vomiting, still very nauseated. Advance diet.  Reports did not have a good night sleep.   Objective: Vitals:   04/26/17 2011 04/27/17 0654 04/27/17 0739 04/27/17 1128  BP: (!) 183/58 (!) 171/73 (!) 183/81 (!) 175/72  Pulse: 74 72 73 68  Resp: 19 16    Temp: 98.8 F (37.1 C) 98.5 F (36.9 C) 98 F (36.7 C) 98.6 F (37 C)  TempSrc: Oral Oral Oral Oral  SpO2: 100% 100%  99%  Weight:  60.2 kg (132 lb 11.2 oz)    Height:        Intake/Output Summary (Last 24 hours) at 04/27/17 1426 Last data filed at 04/27/17 0937  Gross per 24 hour  Intake              980 ml  Output             2500 ml  Net            -1520 ml   Filed Weights   04/24/17 2021 04/26/17 0252 04/27/17 0654  Weight: 59.1 kg (130 lb 4.7 oz) 59 kg (130 lb) 60.2 kg (132 lb 11.2 oz)  Examination:  General exam: Appears calm and comfortable  Respiratory system: Clear to auscultation. Respiratory effort normal. Cardiovascular system: S1 & S2 heard, RRR. No JVD, murmurs, rubs, gallops or clicks. No pedal edema. Gastrointestinal system: abd soft nondistended, tender generalized , bowel sounds heard.  Central nervous system: Alert and oriented. No focal neurological deficits. Extremities: Symmetric 5 x 5 power. Skin: No rashes, lesions or ulcers Psychiatry: Judgement and insight appear normal. Mood & affect appropriate.     Data Reviewed: I have personally reviewed following labs and imaging studies  CBC:  Recent Labs Lab 04/24/17 1310 04/25/17 0300  04/25/17 1829 04/26/17 0315  WBC 10.1 15.0*  --  12.3*  HGB 13.4 12.9 11.6* 10.4*  HCT 43.7 40.8 35.2* 32.1*  MCV 84.2 82.3  --  82.3  PLT 245 224  --  355   Basic Metabolic Panel:  Recent Labs Lab 04/24/17 1310 04/25/17 0300 04/26/17 0315  NA 135 136 145  K 3.4* 3.8 3.6  CL 99* 101 107  CO2 23 24 25   GLUCOSE 223* 148* 136*  BUN 16 15 13   CREATININE 1.08* 0.97 0.79  CALCIUM 9.7 8.8* 8.8*   GFR: Estimated Creatinine Clearance: 36.3 mL/min (by C-G formula based on SCr of 0.79 mg/dL). Liver Function Tests:  Recent Labs Lab 04/24/17 1338  AST 29  ALT 10*  ALKPHOS 70  BILITOT 0.6  PROT 6.7  ALBUMIN 3.7    Recent Labs Lab 04/24/17 1338  LIPASE 24   No results for input(s): AMMONIA in the last 168 hours. Coagulation Profile: No results for input(s): INR, PROTIME in the last 168 hours. Cardiac Enzymes:  Recent Labs Lab 04/24/17 1633 04/25/17 0849  TROPONINI 0.12* 0.08*   BNP (last 3 results) No results for input(s): PROBNP in the last 8760 hours. HbA1C: No results for input(s): HGBA1C in the last 72 hours. CBG:  Recent Labs Lab 04/25/17 2207 04/26/17 0755 04/26/17 2132 04/27/17 0737 04/27/17 1127  GLUCAP 139* 110* 163* 131* 159*   Lipid Profile: No results for input(s): CHOL, HDL, LDLCALC, TRIG, CHOLHDL, LDLDIRECT in the last 72 hours. Thyroid Function Tests:  Recent Labs  04/24/17 2036  TSH 0.446   Anemia Panel:  Recent Labs  04/26/17 1028  VITAMINB12 486  FOLATE 18.5  FERRITIN 135  TIBC 280  IRON 28  RETICCTPCT 1.1   Sepsis Labs: No results for input(s): PROCALCITON, LATICACIDVEN in the last 168 hours.  No results found for this or any previous visit (from the past 240 hour(s)).       Radiology Studies: Mr 3d Recon At Scanner  Result Date: 04/27/2017 CLINICAL DATA:  83 y.o. female who presents with syncope and new onset Afib w/ RVR and dehydration from colitis and use of laxatives. Best scans possible, pt having  difficult time holding her breath.^35mL MULTIHANCE GADOBENATE DIMEGLUMINE 529 MG/ML IV SOLN Minimally dilated common bile duct. EXAM: MRI ABDOMEN WITHOUT AND WITH CONTRAST (INCLUDING MRCP) TECHNIQUE: Multiplanar multisequence MR imaging of the abdomen was performed both before and after the administration of intravenous contrast. Heavily T2-weighted images of the biliary and pancreatic ducts were obtained, and three-dimensional MRCP images were rendered by post processing. CONTRAST:  8mL MULTIHANCE GADOBENATE DIMEGLUMINE 529 MG/ML IV SOLN COMPARISON:  CT 04/24/2017 FINDINGS: Exam degraded by respiratory motion. Lower chest: Lung bases are clear. Hepatobiliary: There is no intrahepatic duct dilatation. There is mild dilatation of the common hepatic duct and common bile duct. There is no obstructing lesion identified. No gallstones present. No choledocholithiasis.  No enhancing lesion identified in the liver Of note, the MRCP contrast-enhanced images are significantly degraded by respiratory motion. Pancreas: Pancreas divisum variant ductal anatomy. There is mild dilatation of the pancreatic duct to 4 mm. There is a cystic change in the pancreatic tail suggesting ductal ectasia (image 21, series 4). No fluid collection surrounding the pancreas. No pancreatic mass lesion identified. Spleen: Normal spleen Adrenals/urinary tract: Adrenal glands are normal. Bosniak 1 renal cyst of the RIGHT kidney Stomach/Bowel: Stomach normal duodenum normal. Again demonstrated is thickening of the transverse colon with edema. Vascular/Lymphatic: Abdominal aortic normal caliber. No abdominal adenopathy Other: No free fluid. Musculoskeletal: No aggressive osseous lesion. IMPRESSION: 1. Pancreas divisum variant ductal anatomy. Mild dilatation of the pancreatic duct and duct ectasia of the pancreatic tail. Favor chronic benign changes of pancreas. 2. Mild dilatation of the common hepatic duct and common bile duct without intrahepatic duct  dilatation. Favor benign senescent dilatation. No choledocholithiasis or pancreatic mass lesion identified. 3. Inflammation involving the transverse colon described in CT 04/24/2017. 4. Exam significantly degraded by respiratory motion. Electronically Signed   By: Suzy Bouchard M.D.   On: 04/27/2017 08:48   Mr Abdomen Mrcp Moise Boring Contast  Result Date: 04/27/2017 CLINICAL DATA:  83 y.o. female who presents with syncope and new onset Afib w/ RVR and dehydration from colitis and use of laxatives. Best scans possible, pt having difficult time holding her breath.^81mL MULTIHANCE GADOBENATE DIMEGLUMINE 529 MG/ML IV SOLN Minimally dilated common bile duct. EXAM: MRI ABDOMEN WITHOUT AND WITH CONTRAST (INCLUDING MRCP) TECHNIQUE: Multiplanar multisequence MR imaging of the abdomen was performed both before and after the administration of intravenous contrast. Heavily T2-weighted images of the biliary and pancreatic ducts were obtained, and three-dimensional MRCP images were rendered by post processing. CONTRAST:  63mL MULTIHANCE GADOBENATE DIMEGLUMINE 529 MG/ML IV SOLN COMPARISON:  CT 04/24/2017 FINDINGS: Exam degraded by respiratory motion. Lower chest: Lung bases are clear. Hepatobiliary: There is no intrahepatic duct dilatation. There is mild dilatation of the common hepatic duct and common bile duct. There is no obstructing lesion identified. No gallstones present. No choledocholithiasis. No enhancing lesion identified in the liver Of note, the MRCP contrast-enhanced images are significantly degraded by respiratory motion. Pancreas: Pancreas divisum variant ductal anatomy. There is mild dilatation of the pancreatic duct to 4 mm. There is a cystic change in the pancreatic tail suggesting ductal ectasia (image 21, series 4). No fluid collection surrounding the pancreas. No pancreatic mass lesion identified. Spleen: Normal spleen Adrenals/urinary tract: Adrenal glands are normal. Bosniak 1 renal cyst of the RIGHT kidney  Stomach/Bowel: Stomach normal duodenum normal. Again demonstrated is thickening of the transverse colon with edema. Vascular/Lymphatic: Abdominal aortic normal caliber. No abdominal adenopathy Other: No free fluid. Musculoskeletal: No aggressive osseous lesion. IMPRESSION: 1. Pancreas divisum variant ductal anatomy. Mild dilatation of the pancreatic duct and duct ectasia of the pancreatic tail. Favor chronic benign changes of pancreas. 2. Mild dilatation of the common hepatic duct and common bile duct without intrahepatic duct dilatation. Favor benign senescent dilatation. No choledocholithiasis or pancreatic mass lesion identified. 3. Inflammation involving the transverse colon described in CT 04/24/2017. 4. Exam significantly degraded by respiratory motion. Electronically Signed   By: Suzy Bouchard M.D.   On: 04/27/2017 08:48        Scheduled Meds: . amLODipine  5 mg Oral Daily  . enoxaparin (LOVENOX) injection  30 mg Subcutaneous Q24H  . insulin aspart  0-15 Units Subcutaneous TID WC  . metoprolol tartrate  25 mg  Oral BID  . pantoprazole (PROTONIX) IV  40 mg Intravenous Q24H  . sodium chloride flush  3 mL Intravenous Q12H   Continuous Infusions: . [START ON 04/28/2017] ciprofloxacin    . metronidazole 500 mg (04/27/17 1345)     LOS: 2 days    Time spent:35 MINUTES.     Hosie Poisson, MD [facility] Pager [phone]  If 7PM-7AM, please contact night-coverage [email or web address] Password [email or web address] 04/27/2017, 2:26 PM

## 2017-04-27 NOTE — Progress Notes (Signed)
ANTIBIOTIC CONSULT NOTE   Pharmacy Consult for Cipro Indication: Colitis  No Known Allergies  Patient Measurements: Height: 5\' 3"  (160 cm) Weight: 132 lb 11.2 oz (60.2 kg) IBW/kg (Calculated) : 52.4 Adjusted Body Weight:    Vital Signs: Temp: 98 F (36.7 C) (08/03 0739) Temp Source: Oral (08/03 0739) BP: 183/81 (08/03 0739) Pulse Rate: 73 (08/03 0739) Intake/Output from previous day: 08/02 0701 - 08/03 0700 In: 1500 [P.O.:1000; IV Piggyback:500] Out: 2000 [Urine:2000] Intake/Output from this shift: Total I/O In: 240 [P.O.:240] Out: 500 [Urine:500]  Labs:  Recent Labs  04/24/17 1310 04/25/17 0300 04/25/17 1829 04/26/17 0315  WBC 10.1 15.0*  --  12.3*  HGB 13.4 12.9 11.6* 10.4*  PLT 245 224  --  190  CREATININE 1.08* 0.97  --  0.79   Estimated Creatinine Clearance: 36.3 mL/min (by C-G formula based on SCr of 0.79 mg/dL). No results for input(s): VANCOTROUGH, VANCOPEAK, VANCORANDOM, GENTTROUGH, GENTPEAK, GENTRANDOM, TOBRATROUGH, TOBRAPEAK, TOBRARND, AMIKACINPEAK, AMIKACINTROU, AMIKACIN in the last 72 hours.   Microbiology: No results found for this or any previous visit (from the past 720 hour(s)).  Medical History: Past Medical History:  Diagnosis Date  . Arthritis    "knees" (04/25/2017)  . Colitis   . Hypertension   . New onset atrial fibrillation ([place]) 04/24/2017   Archie Endo 04/24/2017  . Syncope   . Uterine cancer ([place]) 1960s   "healed of it; no treatment"    Assessment:  ID: Cipro/Flagyl for colitis.  -WBC= 12.3 improving, afeb, CrCl ~ 33, No cultures  Cipro 8/2 >> Flagyl 7/31>>  Goal of Therapy:  Eradication of infection  Plan:  Decrease Cipro to 400mg  IV q24h (borderline CrCl 30).   Kyliana Standen S. Alford Highland, PharmD, BCPS Clinical Staff Pharmacist Pager [phone]  Eilene Ghazi Stillinger 04/27/2017,11:00 AM

## 2017-04-28 LAB — CBC
HCT: 36.8 % (ref 36.0–46.0)
HEMOGLOBIN: 12 g/dL (ref 12.0–15.0)
MCH: 26.4 pg (ref 26.0–34.0)
MCHC: 32.6 g/dL (ref 30.0–36.0)
MCV: 80.9 fL (ref 78.0–100.0)
PLATELETS: 220 10*3/uL (ref 150–400)
RBC: 4.55 MIL/uL (ref 3.87–5.11)
RDW: 13.5 % (ref 11.5–15.5)
WBC: 8.4 10*3/uL (ref 4.0–10.5)

## 2017-04-28 LAB — GLUCOSE, CAPILLARY
GLUCOSE-CAPILLARY: 157 mg/dL — AB (ref 65–99)
Glucose-Capillary: 154 mg/dL — ABNORMAL HIGH (ref 65–99)
Glucose-Capillary: 149 mg/dL — ABNORMAL HIGH (ref 65–99)
Glucose-Capillary: 130 mg/dL — ABNORMAL HIGH (ref 65–99)

## 2017-04-28 MED ORDER — HYDRALAZINE HCL 20 MG/ML IJ SOLN
10.0000 mg | Freq: Four times a day (QID) | INTRAMUSCULAR | Status: DC | PRN
  Administered 2017-04-28: 10 mg via INTRAVENOUS
  Filled 2017-04-28 (×2): qty 1

## 2017-04-28 MED ORDER — AMLODIPINE BESYLATE 10 MG PO TABS
10.0000 mg | ORAL_TABLET | Freq: Every day | ORAL | Status: DC
  Administered 2017-04-29: 10 mg via ORAL
  Filled 2017-04-28: qty 1

## 2017-04-28 NOTE — Progress Notes (Signed)
PROGRESS NOTE    Amanda Rocha  VFI:[id number] DOB: 11-05-1920 DOA: 04/24/2017 PCP: Glendale Chard, MD    Brief Narrative: 83 year old lady with prior h/o of paroxysmal a fib, hypertension, syncope, presents with dizziness, palpitations, diarrhea and dehydration. She was found to bein afib with RVR from dehydration and infectious vs inflammatory colitis.   Assessment & Plan:   Active Problems:   Syncope   Colitis   New onset a-fib (HCC)   Elevated troponin   Hyperglycemia  Paroxysmal atrial fibrillation:  In sinus and rate controlled.  TSH wnl.  Echocardiogram unremarkable.  Probably brought on by dehydration.  No new complaints.    Dehydration probably secondary to diarrhea from colitis.  Improved with IV fluids.    Infectious vs inflammatory colitis.  Pt reports being constipated and used senna leaves with tea to help with constipation, following that she had multiple episodes of nausea, vomiting and diarrhea , abdominal pain. CT showed evidence of colitis in the transverse colon.  Hemoccult was positive. Pt denies ever having Colonoscopy, and refuses to have any invasive procedures at this time.  Currently on IV flagyl, Ciprofloxacin added as she continues to have pain.  Started on clears and advance diet as tolerated.  Currently on soft diet, no BM so far. If able to tolerate soft diet today, plan for D/C in am.    Hypertension:  Suboptimally controlled.  Resume norvasc. Added hydralazine prn.  Increase norvasc to 10 mg daily.    Incidental finding of Diffuse dilatation of the pancreatic duct, measuring up to 7 mm. No obstructing lesion identified. MRI/MRCP of the abdomen with and without contrast is recommended to assess for possible ampullary Mass. Pancreas divisum variant ductal anatomy. Mild dilatation of the pancreatic duct and duct ectasia of the pancreatic tail. Favor chronic benign changes of pancreas.   Anemia. Anemia of chronic disease vs anemia of  blood loss from colitis.  Hemoccult positive.  Anemia panel showed normal ferritin and normal iron stores b12 is 486.  Continue to monitor. .    Leukocytosis:  resolved. Normal WBC count today.   Dizziness and palpitations: resolved. PT evaluation ordered .  Recommended HOME health.   DVT prophylaxis: (Lovenox) Code Status: (Full) Family Communication: discussed with daughter at bedside.  Disposition Plan: home tomorrow.   Consultants:   None.    Procedures: CT abd and pelvis  Echocardiogram.   MRI of the abdomen.    Antimicrobials: ciprofloxacin and flagyl    Subjective: Nausea, improved, no vomiting. advaced diet to soft,  No BM yet.  Objective: Vitals:   04/27/17 1941 04/27/17 2246 04/28/17 0430 04/28/17 1122  BP: (!) 182/74  (!) 190/83 (!) 188/84  Pulse: 72 72 76   Resp: 16  16   Temp: 99.1 F (37.3 C)  98 F (36.7 C)   TempSrc: Oral  Oral   SpO2: 100%  99%   Weight:   57.8 kg (127 lb 8 oz)   Height:        Intake/Output Summary (Last 24 hours) at 04/28/17 1248 Last data filed at 04/28/17 0548  Gross per 24 hour  Intake              860 ml  Output             1150 ml  Net             -290 ml   Filed Weights   04/26/17 0252 04/27/17 0654 04/28/17 0430  Weight: 59  kg (130 lb) 60.2 kg (132 lb 11.2 oz) 57.8 kg (127 lb 8 oz)    Examination:  General exam: Appears calm and comfortable not in any distress.  Respiratory system: Clear to auscultation. Respiratory effort normal. No wheezing or rhonchi.  Cardiovascular system: S1 & S2 heard, RRR. No JVD, murmurs, rubs, gallops or clicks. No pedal edema. Gastrointestinal system: abd soft nondistended, tenderness improved. bowel sounds heard.  Central nervous system: Alert and oriented. No focal neurological deficits. Extremities: Symmetric 5 x 5 power.no cyanosis or clubbing.  Skin: No rashes, lesions or ulcers Psychiatry: Judgement and insight appear normal. Mood & affect appropriate.     Data  Reviewed: I have personally reviewed following labs and imaging studies  CBC:  Recent Labs Lab 04/24/17 1310 04/25/17 0300 04/25/17 1829 04/26/17 0315 04/28/17 0251  WBC 10.1 15.0*  --  12.3* 8.4  HGB 13.4 12.9 11.6* 10.4* 12.0  HCT 43.7 40.8 35.2* 32.1* 36.8  MCV 84.2 82.3  --  82.3 80.9  PLT 245 224  --  190 607   Basic Metabolic Panel:  Recent Labs Lab 04/24/17 1310 04/25/17 0300 04/26/17 0315  NA 135 136 145  K 3.4* 3.8 3.6  CL 99* 101 107  CO2 23 24 25   GLUCOSE 223* 148* 136*  BUN 16 15 13   CREATININE 1.08* 0.97 0.79  CALCIUM 9.7 8.8* 8.8*   GFR: Estimated Creatinine Clearance: 36.3 mL/min (by C-G formula based on SCr of 0.79 mg/dL). Liver Function Tests:  Recent Labs Lab 04/24/17 1338  AST 29  ALT 10*  ALKPHOS 70  BILITOT 0.6  PROT 6.7  ALBUMIN 3.7    Recent Labs Lab 04/24/17 1338  LIPASE 24   No results for input(s): AMMONIA in the last 168 hours. Coagulation Profile: No results for input(s): INR, PROTIME in the last 168 hours. Cardiac Enzymes:  Recent Labs Lab 04/24/17 1633 04/25/17 0849  TROPONINI 0.12* 0.08*   BNP (last 3 results) No results for input(s): PROBNP in the last 8760 hours. HbA1C: No results for input(s): HGBA1C in the last 72 hours. CBG:  Recent Labs Lab 04/27/17 1127 04/27/17 1639 04/27/17 2207 04/28/17 0824 04/28/17 1151  GLUCAP 159* 124* 143* 154* 149*   Lipid Profile: No results for input(s): CHOL, HDL, LDLCALC, TRIG, CHOLHDL, LDLDIRECT in the last 72 hours. Thyroid Function Tests: No results for input(s): TSH, T4TOTAL, FREET4, T3FREE, THYROIDAB in the last 72 hours. Anemia Panel:  Recent Labs  04/26/17 1028  VITAMINB12 486  FOLATE 18.5  FERRITIN 135  TIBC 280  IRON 28  RETICCTPCT 1.1   Sepsis Labs: No results for input(s): PROCALCITON, LATICACIDVEN in the last 168 hours.  No results found for this or any previous visit (from the past 240 hour(s)).       Radiology Studies: Mr 3d Recon  At Scanner  Result Date: 04/27/2017 CLINICAL DATA:  83 y.o. female who presents with syncope and new onset Afib w/ RVR and dehydration from colitis and use of laxatives. Best scans possible, pt having difficult time holding her breath.^5mL MULTIHANCE GADOBENATE DIMEGLUMINE 529 MG/ML IV SOLN Minimally dilated common bile duct. EXAM: MRI ABDOMEN WITHOUT AND WITH CONTRAST (INCLUDING MRCP) TECHNIQUE: Multiplanar multisequence MR imaging of the abdomen was performed both before and after the administration of intravenous contrast. Heavily T2-weighted images of the biliary and pancreatic ducts were obtained, and three-dimensional MRCP images were rendered by post processing. CONTRAST:  74mL MULTIHANCE GADOBENATE DIMEGLUMINE 529 MG/ML IV SOLN COMPARISON:  CT 04/24/2017 FINDINGS: Exam degraded  by respiratory motion. Lower chest: Lung bases are clear. Hepatobiliary: There is no intrahepatic duct dilatation. There is mild dilatation of the common hepatic duct and common bile duct. There is no obstructing lesion identified. No gallstones present. No choledocholithiasis. No enhancing lesion identified in the liver Of note, the MRCP contrast-enhanced images are significantly degraded by respiratory motion. Pancreas: Pancreas divisum variant ductal anatomy. There is mild dilatation of the pancreatic duct to 4 mm. There is a cystic change in the pancreatic tail suggesting ductal ectasia (image 21, series 4). No fluid collection surrounding the pancreas. No pancreatic mass lesion identified. Spleen: Normal spleen Adrenals/urinary tract: Adrenal glands are normal. Bosniak 1 renal cyst of the RIGHT kidney Stomach/Bowel: Stomach normal duodenum normal. Again demonstrated is thickening of the transverse colon with edema. Vascular/Lymphatic: Abdominal aortic normal caliber. No abdominal adenopathy Other: No free fluid. Musculoskeletal: No aggressive osseous lesion. IMPRESSION: 1. Pancreas divisum variant ductal anatomy. Mild  dilatation of the pancreatic duct and duct ectasia of the pancreatic tail. Favor chronic benign changes of pancreas. 2. Mild dilatation of the common hepatic duct and common bile duct without intrahepatic duct dilatation. Favor benign senescent dilatation. No choledocholithiasis or pancreatic mass lesion identified. 3. Inflammation involving the transverse colon described in CT 04/24/2017. 4. Exam significantly degraded by respiratory motion. Electronically Signed   By: Suzy Bouchard M.D.   On: 04/27/2017 08:48   Mr Abdomen Mrcp Moise Boring Contast  Result Date: 04/27/2017 CLINICAL DATA:  83 y.o. female who presents with syncope and new onset Afib w/ RVR and dehydration from colitis and use of laxatives. Best scans possible, pt having difficult time holding her breath.^69mL MULTIHANCE GADOBENATE DIMEGLUMINE 529 MG/ML IV SOLN Minimally dilated common bile duct. EXAM: MRI ABDOMEN WITHOUT AND WITH CONTRAST (INCLUDING MRCP) TECHNIQUE: Multiplanar multisequence MR imaging of the abdomen was performed both before and after the administration of intravenous contrast. Heavily T2-weighted images of the biliary and pancreatic ducts were obtained, and three-dimensional MRCP images were rendered by post processing. CONTRAST:  50mL MULTIHANCE GADOBENATE DIMEGLUMINE 529 MG/ML IV SOLN COMPARISON:  CT 04/24/2017 FINDINGS: Exam degraded by respiratory motion. Lower chest: Lung bases are clear. Hepatobiliary: There is no intrahepatic duct dilatation. There is mild dilatation of the common hepatic duct and common bile duct. There is no obstructing lesion identified. No gallstones present. No choledocholithiasis. No enhancing lesion identified in the liver Of note, the MRCP contrast-enhanced images are significantly degraded by respiratory motion. Pancreas: Pancreas divisum variant ductal anatomy. There is mild dilatation of the pancreatic duct to 4 mm. There is a cystic change in the pancreatic tail suggesting ductal ectasia (image 21,  series 4). No fluid collection surrounding the pancreas. No pancreatic mass lesion identified. Spleen: Normal spleen Adrenals/urinary tract: Adrenal glands are normal. Bosniak 1 renal cyst of the RIGHT kidney Stomach/Bowel: Stomach normal duodenum normal. Again demonstrated is thickening of the transverse colon with edema. Vascular/Lymphatic: Abdominal aortic normal caliber. No abdominal adenopathy Other: No free fluid. Musculoskeletal: No aggressive osseous lesion. IMPRESSION: 1. Pancreas divisum variant ductal anatomy. Mild dilatation of the pancreatic duct and duct ectasia of the pancreatic tail. Favor chronic benign changes of pancreas. 2. Mild dilatation of the common hepatic duct and common bile duct without intrahepatic duct dilatation. Favor benign senescent dilatation. No choledocholithiasis or pancreatic mass lesion identified. 3. Inflammation involving the transverse colon described in CT 04/24/2017. 4. Exam significantly degraded by respiratory motion. Electronically Signed   By: Suzy Bouchard M.D.   On: 04/27/2017 08:48  Scheduled Meds: . amLODipine  5 mg Oral Daily  . enoxaparin (LOVENOX) injection  30 mg Subcutaneous Q24H  . insulin aspart  0-15 Units Subcutaneous TID WC  . metoprolol tartrate  25 mg Oral BID  . pantoprazole (PROTONIX) IV  40 mg Intravenous Q24H  . sodium chloride flush  3 mL Intravenous Q12H   Continuous Infusions: . ciprofloxacin 400 mg (04/28/17 0548)  . metronidazole Stopped (04/27/17 2300)     LOS: 3 days    Time spent:35 MINUTES.     Hosie Poisson, MD [facility] Pager [phone]  If 7PM-7AM, please contact night-coverage [email or web address] Password [email or web address] 04/28/2017, 12:48 PM

## 2017-04-29 LAB — GLUCOSE, CAPILLARY
GLUCOSE-CAPILLARY: 102 mg/dL — AB (ref 65–99)
Glucose-Capillary: 166 mg/dL — ABNORMAL HIGH (ref 65–99)

## 2017-04-29 MED ORDER — ONDANSETRON HCL 4 MG PO TABS: 4.0000 mg | ORAL_TABLET | Freq: Four times a day (QID) | ORAL | 0 refills | Status: DC | PRN

## 2017-04-29 MED ORDER — PANTOPRAZOLE SODIUM 40 MG PO TBEC: 40.0000 mg | DELAYED_RELEASE_TABLET | Freq: Every day | ORAL | 0 refills | Status: DC

## 2017-04-29 MED ORDER — AMLODIPINE BESYLATE 10 MG PO TABS: 10.0000 mg | ORAL_TABLET | Freq: Every day | ORAL | 1 refills | Status: DC

## 2017-04-29 MED ORDER — METRONIDAZOLE 500 MG PO TABS: 500.0000 mg | ORAL_TABLET | Freq: Three times a day (TID) | ORAL | 0 refills | Status: DC

## 2017-04-29 MED ORDER — PANTOPRAZOLE SODIUM 40 MG PO TBEC
40.0000 mg | DELAYED_RELEASE_TABLET | Freq: Every day | ORAL | Status: DC
  Administered 2017-04-29: 40 mg via ORAL
  Filled 2017-04-29 (×3): qty 1

## 2017-04-29 MED ORDER — CIPROFLOXACIN HCL 100 MG PO TABS: 250.0000 mg | ORAL_TABLET | Freq: Two times a day (BID) | ORAL | 0 refills | Status: AC

## 2017-04-29 MED ORDER — LISINOPRIL-HYDROCHLOROTHIAZIDE 20-12.5 MG PO TABS: 1.0000 | ORAL_TABLET | Freq: Every day | ORAL | 0 refills | Status: DC

## 2017-04-29 MED ORDER — METRONIDAZOLE 500 MG PO TABS: 500.0000 mg | ORAL_TABLET | Freq: Three times a day (TID) | ORAL | 0 refills | Status: AC

## 2017-04-29 MED ORDER — CIPROFLOXACIN HCL 100 MG PO TABS: 250.0000 mg | ORAL_TABLET | Freq: Two times a day (BID) | ORAL | 0 refills | Status: DC

## 2017-04-29 NOTE — Progress Notes (Signed)
PHARMACIST - PHYSICIAN COMMUNICATION  CONCERNING: IV to Oral Route Change Policy  RECOMMENDATION: This patient is receiving pantoprazole by the intravenous route.  Based on criteria approved by the Pharmacy and Therapeutics Committee, the intravenous medication(s) is/are being converted to the equivalent oral dose form(s).   DESCRIPTION: These criteria include:  The patient is eating (either orally or via tube) and/or has been taking other orally administered medications for a least 24 hours  The patient has no evidence of active gastrointestinal bleeding or impaired GI absorption (gastrectomy, short bowel, patient on TNA or NPO).  If you have questions about this conversion, please contact the Pharmacy Department  []  ( [phone] )   []  ( [phone] )  [facility] [x]  ( [phone]  [place] []  ( [phone] )  [facility] []  ( [phone] )  [facility]   Eugenio Dollins N. Grettell Ransdell, PharmD PGY1 Pharmacy Resident Pager: [phone]  

## 2017-04-29 NOTE — Progress Notes (Signed)
Report received in patient's room via Marya Amsler RN using SBAR format, reviewed VS, PMH, some labs and patient's general condition, assumed care of patient

## 2017-04-29 NOTE — Plan of Care (Signed)
Problem: Health Behavior/Discharge Planning: Goal: Ability to manage health-related needs will improve Outcome: Progressing Discussed discharge instructions with pt and family. Verbalized understanding.

## 2017-04-30 NOTE — Discharge Summary (Signed)
Physician Discharge Summary  Amanda Rocha WNI:[id number] DOB: [birth year] DOA: 04/24/2017  PCP: Glendale Chard, MD  Admit date: 04/24/2017 Discharge date: 04/30/2017  Admitted From: [facility].  Disposition:  Home  Recommendations for Outpatient Follow-up:  1. Follow up with PCP in 1-2 weeks 2. Please obtain BMP/CBC in one week   Discharge Condition: stable. CODE STATUS: full code Diet recommendation: Heart Healthy   Brief/Interim Summary: 83 year old lady with prior h/o of paroxysmal a fib, hypertension, syncope, presents with dizziness, palpitations, diarrhea and dehydration. She was found to bein afib with RVR from dehydration and infectious vs inflammatory colitis.   Discharge Diagnoses:  Active Problems:   Syncope   Colitis   New onset a-fib (HCC)   Elevated troponin   Hyperglycemia  Paroxysmal atrial fibrillation:  In sinus and rate controlled.  TSH wnl.  Echocardiogram unremarkable.  Probably brought on by dehydration.  No new complaints.    Dehydration probably secondary to diarrhea from colitis.  Improved with IV fluids.    Infectious vs inflammatory colitis.  Pt reports being constipated and used senna leaves with tea to help with constipation, following that she had multiple episodes of nausea, vomiting and diarrhea , abdominal pain. CT showed evidence of colitis in the transverse colon.  Hemoccult was positive. Pt denies ever having Colonoscopy, and refuses to have any invasive procedures at this time.  Currently on IV flagyl, Ciprofloxacin added as she continues to have pain.  Started on clears and advance diet as tolerated.  Currently on soft diet, able to tolerate without any symptoms. Discharged her on po antibiotics to complete the course.     Hypertension: better controlled. Increase norvasc to 10 mg daily.    Incidental finding of Diffuse dilatation of the pancreatic duct, measuring up to 7 mm. No obstructing lesion identified. MRI/MRCP of the  abdomen with and without contrast is recommended to assess for possible ampullary Mass. Pancreas divisum variant ductal anatomy. Mild dilatation of the pancreatic duct and duct ectasia of the pancreatic tail. Favor chronic benign changes of pancreas.   Anemia. Anemia of chronic disease vs anemia of blood loss from colitis.  Hemoccult positive.  Anemia panel showed normal ferritin and normal iron stores b12 is 486.  Continue to monitor. .  Patient does nto want further work up with a colonoscopy or sigmoidoscopy.  Would recommend follow up with PCP at a later time.    Leukocytosis:  resolved. Normal WBC count today.   Dizziness and palpitations: resolved. PT evaluation ordered .  Recommended HOME health.   Discharge Instructions  Discharge Instructions    Diet - low sodium heart healthy    Complete by:  As directed      Allergies as of 04/29/2017   No Known Allergies     Medication List    TAKE these medications   amLODipine 10 MG tablet Commonly known as:  NORVASC Take 1 tablet (10 mg total) by mouth daily. What changed:  medication strength  how much to take   aspirin EC 81 MG tablet Take 81 mg by mouth daily.   ciprofloxacin 100 MG tablet Commonly known as:  CIPRO Take 2.5 tablets (250 mg total) by mouth 2 (two) times daily.   ENSURE Take 237 mLs by mouth daily.   lisinopril-hydrochlorothiazide 20-12.5 MG tablet Commonly known as:  PRINZIDE,ZESTORETIC Take 1 tablet by mouth daily.   metroNIDAZOLE 500 MG tablet Commonly known as:  FLAGYL Take 1 tablet (500 mg total) by mouth 3 (three) times daily.  ondansetron 4 MG tablet Commonly known as:  ZOFRAN Take 1 tablet (4 mg total) by mouth every 6 (six) hours as needed for nausea.   pantoprazole 40 MG tablet Commonly known as:  PROTONIX Take 1 tablet (40 mg total) by mouth daily.   SENNA PO Take 1 Package by mouth as needed. Senna leaves tea      Follow-up Information    Glendale Chard, MD.  Schedule an appointment as soon as possible for a visit in 1 week(s).   Specialty:  Internal Medicine Why:  follow up hospitalization in one week.  Contact information: [address] [place] [address] [phone]          No Known Allergies  Consultations:  None.    Procedures/Studies: Ct Abdomen Pelvis W Contrast  Result Date: 04/24/2017 CLINICAL DATA:  Abdominal pain EXAM: CT ABDOMEN AND PELVIS WITH CONTRAST TECHNIQUE: Multidetector CT imaging of the abdomen and pelvis was performed using the standard protocol following bolus administration of intravenous contrast. CONTRAST:  100 mL Isovue-300 COMPARISON:  None. FINDINGS: Lower chest: No pulmonary nodules or pleural effusion. No visible pericardial effusion. Hepatobiliary: There is hepatic steatosis. No focal liver lesion. No biliary dilatation. Normal gallbladder. Pancreas: There is diffuse pancreatic ductal dilatation, measuring up to 7 mm. No focal lesion identified. Spleen: Normal. Adrenals/Urinary Tract: --Adrenal glands: Normal. --Right kidney/ureter: No hydronephrosis or perinephric stranding. No nephrolithiasis. No obstructing ureteral stones. 2.5 cm right renal cyst. --Left kidney/ureter: No hydronephrosis or perinephric stranding. No nephrolithiasis. No obstructing ureteral stones. --Urinary bladder: Unremarkable. Stomach/Bowel: --Stomach/Duodenum: No hiatal hernia or other gastric abnormality. Normal duodenal course and caliber. --Small bowel: No dilatation or inflammation. --Colon: There is wall thickening and surrounding inflammation of the distal transverse colon, descending colon and sigmoid colon. There is sigmoid diverticulosis. --Appendix: Normal. Vascular/Lymphatic: There is atherosclerotic calcification of the non aneurysmal abdominal aorta. No abdominal or pelvic lymphadenopathy. Reproductive: Small amount of free fluid in the pelvis. Uterus is normal. No adnexal mass. Musculoskeletal. Multilevel  degenerative disc disease and facet arthrosis. No bony spinal canal stenosis. Other: Small amounts of fluid adjacent to the descending colon. IMPRESSION: 1. Long segment inflammation of the colon extending from the distal transverse colon to the sigmoid colon. Small amount of surrounding free fluid but no abscess or drainable collection. Findings suggest an infectious or inflammatory colitis. 2. Diffuse dilatation of the pancreatic duct, measuring up to 7 mm. No obstructing lesion identified. MRI/MRCP of the abdomen with and without contrast is recommended to assess for possible ampullary mass. 3.  Aortic Atherosclerosis (ICD10-I70.0). Electronically Signed   By: Ulyses Jarred M.D.   On: 04/24/2017 15:23   Mr 3d Recon At Scanner  Result Date: 04/27/2017 CLINICAL DATA:  83 y.o. female who presents with syncope and new onset Afib w/ RVR and dehydration from colitis and use of laxatives. Best scans possible, pt having difficult time holding her breath.^43mL MULTIHANCE GADOBENATE DIMEGLUMINE 529 MG/ML IV SOLN Minimally dilated common bile duct. EXAM: MRI ABDOMEN WITHOUT AND WITH CONTRAST (INCLUDING MRCP) TECHNIQUE: Multiplanar multisequence MR imaging of the abdomen was performed both before and after the administration of intravenous contrast. Heavily T2-weighted images of the biliary and pancreatic ducts were obtained, and three-dimensional MRCP images were rendered by post processing. CONTRAST:  63mL MULTIHANCE GADOBENATE DIMEGLUMINE 529 MG/ML IV SOLN COMPARISON:  CT 04/24/2017 FINDINGS: Exam degraded by respiratory motion. Lower chest: Lung bases are clear. Hepatobiliary: There is no intrahepatic duct dilatation. There is mild dilatation of the common hepatic duct  and common bile duct. There is no obstructing lesion identified. No gallstones present. No choledocholithiasis. No enhancing lesion identified in the liver Of note, the MRCP contrast-enhanced images are significantly degraded by respiratory motion.  Pancreas: Pancreas divisum variant ductal anatomy. There is mild dilatation of the pancreatic duct to 4 mm. There is a cystic change in the pancreatic tail suggesting ductal ectasia (image 21, series 4). No fluid collection surrounding the pancreas. No pancreatic mass lesion identified. Spleen: Normal spleen Adrenals/urinary tract: Adrenal glands are normal. Bosniak 1 renal cyst of the RIGHT kidney Stomach/Bowel: Stomach normal duodenum normal. Again demonstrated is thickening of the transverse colon with edema. Vascular/Lymphatic: Abdominal aortic normal caliber. No abdominal adenopathy Other: No free fluid. Musculoskeletal: No aggressive osseous lesion. IMPRESSION: 1. Pancreas divisum variant ductal anatomy. Mild dilatation of the pancreatic duct and duct ectasia of the pancreatic tail. Favor chronic benign changes of pancreas. 2. Mild dilatation of the common hepatic duct and common bile duct without intrahepatic duct dilatation. Favor benign senescent dilatation. No choledocholithiasis or pancreatic mass lesion identified. 3. Inflammation involving the transverse colon described in CT 04/24/2017. 4. Exam significantly degraded by respiratory motion. Electronically Signed   By: Suzy Bouchard M.D.   On: 04/27/2017 08:48   Portable Chest 1 View  Result Date: 04/24/2017 CLINICAL DATA:  83 year old presenting with a near syncopal episode earlier today, now with generalized weakness. EXAM: PORTABLE CHEST 1 VIEW COMPARISON:  07/25/2007. FINDINGS: Cardiac silhouette mildly enlarged for AP portable technique. Thoracic aorta atherosclerotic. Hilar and mediastinal contours otherwise unremarkable. Mild linear atelectasis at the left lung base. Lungs otherwise clear. Pulmonary vascularity normal. No pleural effusions. IMPRESSION: 1. Linear atelectasis involving the left lung base. No acute cardiopulmonary disease otherwise. 2.  Aortic Atherosclerosis (ICD10-170.0) Electronically Signed   By: Evangeline Dakin M.D.    On: 04/24/2017 16:55   Mr Abdomen Mrcp Moise Boring Contast  Result Date: 04/27/2017 CLINICAL DATA:  83 y.o. female who presents with syncope and new onset Afib w/ RVR and dehydration from colitis and use of laxatives. Best scans possible, pt having difficult time holding her breath.^74mL MULTIHANCE GADOBENATE DIMEGLUMINE 529 MG/ML IV SOLN Minimally dilated common bile duct. EXAM: MRI ABDOMEN WITHOUT AND WITH CONTRAST (INCLUDING MRCP) TECHNIQUE: Multiplanar multisequence MR imaging of the abdomen was performed both before and after the administration of intravenous contrast. Heavily T2-weighted images of the biliary and pancreatic ducts were obtained, and three-dimensional MRCP images were rendered by post processing. CONTRAST:  97mL MULTIHANCE GADOBENATE DIMEGLUMINE 529 MG/ML IV SOLN COMPARISON:  CT 04/24/2017 FINDINGS: Exam degraded by respiratory motion. Lower chest: Lung bases are clear. Hepatobiliary: There is no intrahepatic duct dilatation. There is mild dilatation of the common hepatic duct and common bile duct. There is no obstructing lesion identified. No gallstones present. No choledocholithiasis. No enhancing lesion identified in the liver Of note, the MRCP contrast-enhanced images are significantly degraded by respiratory motion. Pancreas: Pancreas divisum variant ductal anatomy. There is mild dilatation of the pancreatic duct to 4 mm. There is a cystic change in the pancreatic tail suggesting ductal ectasia (image 21, series 4). No fluid collection surrounding the pancreas. No pancreatic mass lesion identified. Spleen: Normal spleen Adrenals/urinary tract: Adrenal glands are normal. Bosniak 1 renal cyst of the RIGHT kidney Stomach/Bowel: Stomach normal duodenum normal. Again demonstrated is thickening of the transverse colon with edema. Vascular/Lymphatic: Abdominal aortic normal caliber. No abdominal adenopathy Other: No free fluid. Musculoskeletal: No aggressive osseous lesion. IMPRESSION: 1. Pancreas  divisum variant ductal anatomy. Mild dilatation  of the pancreatic duct and duct ectasia of the pancreatic tail. Favor chronic benign changes of pancreas. 2. Mild dilatation of the common hepatic duct and common bile duct without intrahepatic duct dilatation. Favor benign senescent dilatation. No choledocholithiasis or pancreatic mass lesion identified. 3. Inflammation involving the transverse colon described in CT 04/24/2017. 4. Exam significantly degraded by respiratory motion. Electronically Signed   By: Suzy Bouchard M.D.   On: 04/27/2017 08:48       Subjective: Tolerating soft diet, no new complaints.   Discharge Exam: Vitals:   04/29/17 0500 04/29/17 1418  BP: (!) 162/80 (!) 131/48  Pulse: 90 84  Resp: 16 18  Temp: 98.6 F (37 C) 97.9 F (36.6 C)   Vitals:   04/28/17 2130 04/28/17 2132 04/29/17 0500 04/29/17 1418  BP: (!) 187/86 (!) 187/86 (!) 162/80 (!) 131/48  Pulse: 82 86 90 84  Resp:  16 16 18   Temp:  98.7 F (37.1 C) 98.6 F (37 C) 97.9 F (36.6 C)  TempSrc:  Oral Oral Oral  SpO2:  98% 96% 98%  Weight:   59.8 kg (131 lb 14.4 oz)   Height:        General: Pt is alert, awake, not in acute distress Cardiovascular: RRR, S1/S2 +, no rubs, no gallops Respiratory: CTA bilaterally, no wheezing, no rhonchi Abdominal: Soft, NT, ND, bowel sounds + Extremities: no edema, no cyanosis    The results of significant diagnostics from this hospitalization (including imaging, microbiology, ancillary and laboratory) are listed below for reference.     Microbiology: No results found for this or any previous visit (from the past 240 hour(s)).   Labs: BNP (last 3 results) No results for input(s): BNP in the last 8760 hours. Basic Metabolic Panel:  Recent Labs Lab 04/24/17 1310 04/25/17 0300 04/26/17 0315  NA 135 136 145  K 3.4* 3.8 3.6  CL 99* 101 107  CO2 23 24 25   GLUCOSE 223* 148* 136*  BUN 16 15 13   CREATININE 1.08* 0.97 0.79  CALCIUM 9.7 8.8* 8.8*   Liver  Function Tests:  Recent Labs Lab 04/24/17 1338  AST 29  ALT 10*  ALKPHOS 70  BILITOT 0.6  PROT 6.7  ALBUMIN 3.7    Recent Labs Lab 04/24/17 1338  LIPASE 24   No results for input(s): AMMONIA in the last 168 hours. CBC:  Recent Labs Lab 04/24/17 1310 04/25/17 0300 04/25/17 1829 04/26/17 0315 04/28/17 0251  WBC 10.1 15.0*  --  12.3* 8.4  HGB 13.4 12.9 11.6* 10.4* 12.0  HCT 43.7 40.8 35.2* 32.1* 36.8  MCV 84.2 82.3  --  82.3 80.9  PLT 245 224  --  190 220   Cardiac Enzymes:  Recent Labs Lab 04/24/17 1633 04/25/17 0849  TROPONINI 0.12* 0.08*   BNP: Invalid input(s): POCBNP CBG:  Recent Labs Lab 04/28/17 1151 04/28/17 1728 04/28/17 2216 04/29/17 0756 04/29/17 1110  GLUCAP 149* 130* 157* 102* 166*   D-Dimer No results for input(s): DDIMER in the last 72 hours. Hgb A1c No results for input(s): HGBA1C in the last 72 hours. Lipid Profile No results for input(s): CHOL, HDL, LDLCALC, TRIG, CHOLHDL, LDLDIRECT in the last 72 hours. Thyroid function studies No results for input(s): TSH, T4TOTAL, T3FREE, THYROIDAB in the last 72 hours.  Invalid input(s): FREET3 Anemia work up No results for input(s): VITAMINB12, FOLATE, FERRITIN, TIBC, IRON, RETICCTPCT in the last 72 hours. Urinalysis    Component Value Date/Time   COLORURINE YELLOW 04/24/2017 1532  APPEARANCEUR HAZY (A) 04/24/2017 1532   LABSPEC 1.016 04/24/2017 1532   PHURINE 6.0 04/24/2017 1532   GLUCOSEU 50 (A) 04/24/2017 1532   HGBUR MODERATE (A) 04/24/2017 1532   BILIRUBINUR NEGATIVE 04/24/2017 1532   KETONESUR NEGATIVE 04/24/2017 1532   PROTEINUR NEGATIVE 04/24/2017 1532   UROBILINOGEN 0.2 06/11/2010 2321   NITRITE NEGATIVE 04/24/2017 1532   LEUKOCYTESUR SMALL (A) 04/24/2017 1532   Sepsis Labs Invalid input(s): PROCALCITONIN,  WBC,  LACTICIDVEN Microbiology No results found for this or any previous visit (from the past 240 hour(s)).   Time coordinating discharge: Over 30  minutes  SIGNED:   Hosie Poisson, MD  [facility] 04/30/2017, 8:38 AM Pager   If 7PM-7AM, please contact night-coverage [email or web address] Password [email or web address]

## 2017-12-28 ENCOUNTER — Other Ambulatory Visit: Payer: Self-pay | Admitting: Nurse Practitioner

## 2017-12-28 DIAGNOSIS — E2839 Other primary ovarian failure: Secondary | ICD-10-CM

## 2017-12-28 DIAGNOSIS — Z139 Encounter for screening, unspecified: Secondary | ICD-10-CM

## 2018-06-18 IMAGING — MR MR ABDOMEN WO/W CM MRCP
12 of 21 series · 19 of 48 positions shown · IV contrast (multihance)
Comparison: CT 04/24/2017

CLINICAL DATA: 89 y.o. female who presents with syncope and new
onset Afib w/ RVR and dehydration from colitis and use of laxatives.
Best scans possible, pt having difficult time holding her
breath.^12mL MULTIHANCE GADOBENATE DIMEGLUMINE 529 MG/ML IV SOLN

Minimally dilated common bile duct.
EXAM:
MRI ABDOMEN WITHOUT AND WITH CONTRAST (INCLUDING MRCP)
TECHNIQUE: Multiplanar multisequence MR imaging of the abdomen was performed
both before and after the administration of intravenous contrast.
Heavily T2-weighted images of the biliary and pancreatic ducts were
obtained, and three-dimensional MRCP images were rendered by post
processing.
CONTRAST:  12mL MULTIHANCE GADOBENATE DIMEGLUMINE 529 MG/ML IV SOLN

[Series 3: T2 fat-sat · axial · 5.0mm · 0.78mm/px · 1 of 44 slices shown]
[im 1/44]
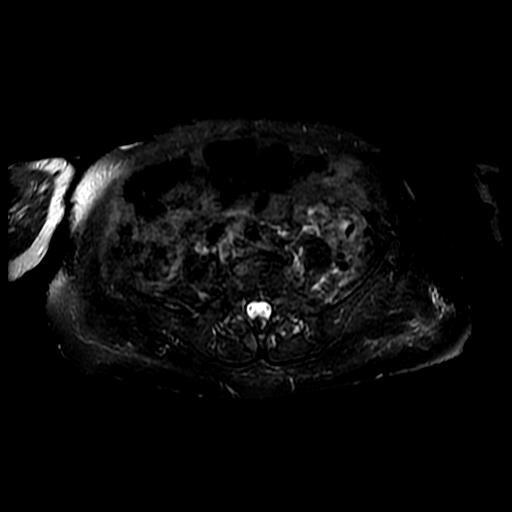

[Series 4: T2 · axial · 5.0mm · 0.78mm/px · 1 of 44 slices shown (1 of 2)]
[im 1/44]
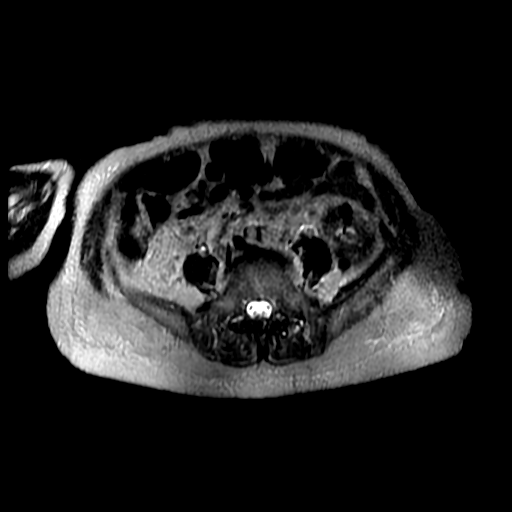

[Series 5: MRCP · coronal · 2.0mm · 0.70mm/px · 3 of 103 slices shown (1 of 2)]
[im 1/103]
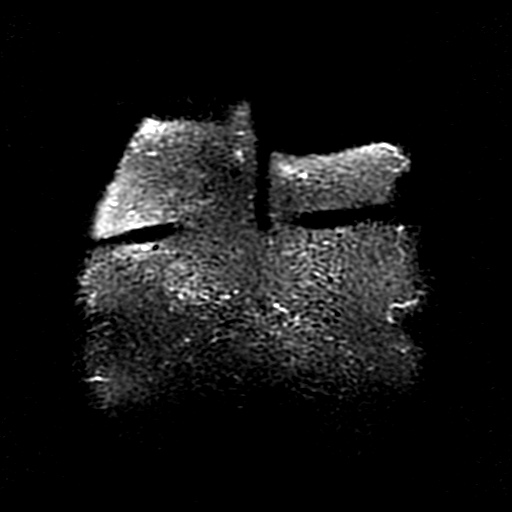
[im 52/103]
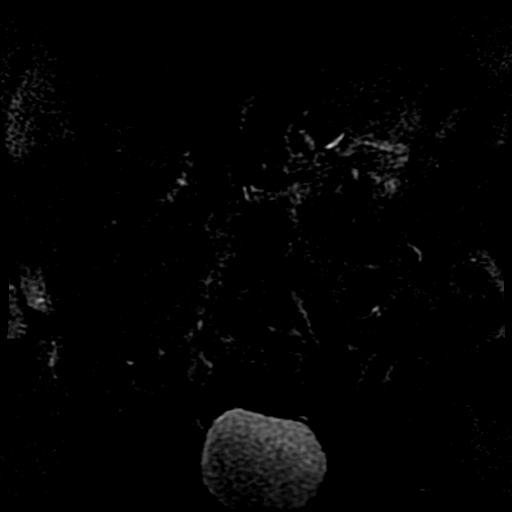
[im 103/103]
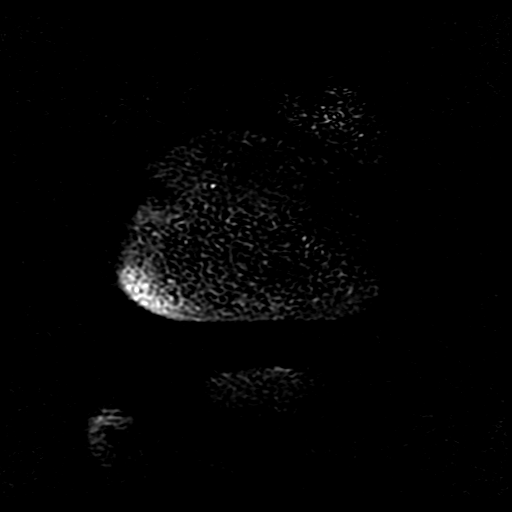

[Series 6: T2 · coronal · 5.0mm · 0.82mm/px · 1 of 41 slices shown (2 of 2)]
[im 1/41]
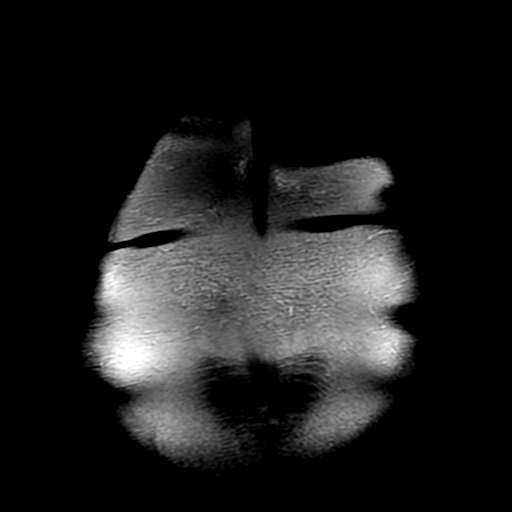

[Series 8: DWI b500 · axial · 6.0mm · 1.48mm/px · z∈[-32,+179]mm · 2 of 56 slices shown]
[im 1/56]
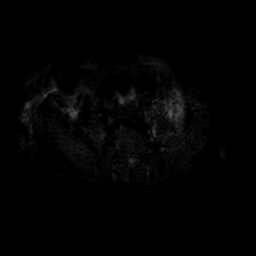
[im 56/56]
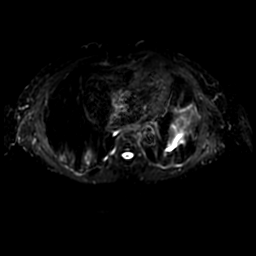

[Series 11: ax dualecho · axial · 5.0mm · 0.78mm/px · z∈[-21,+189]mm · 3 of 86 slices shown]
[im 1/86]
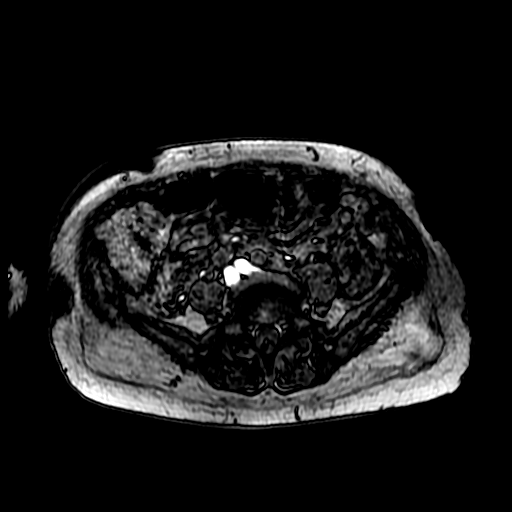
[im 43/86]
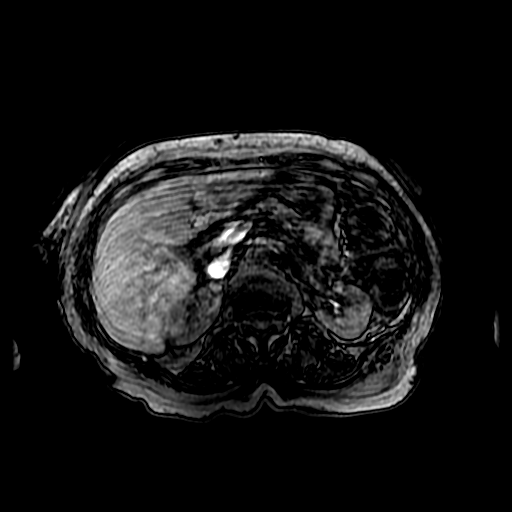
[im 86/86]
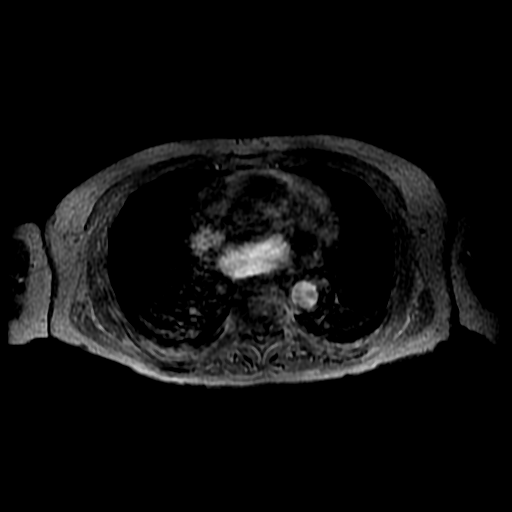

[Series 12: MRCP · oblique · 50.0mm · 0.70mm/px · 1 of 6 slices shown (2 of 2)]
[im 1/6]
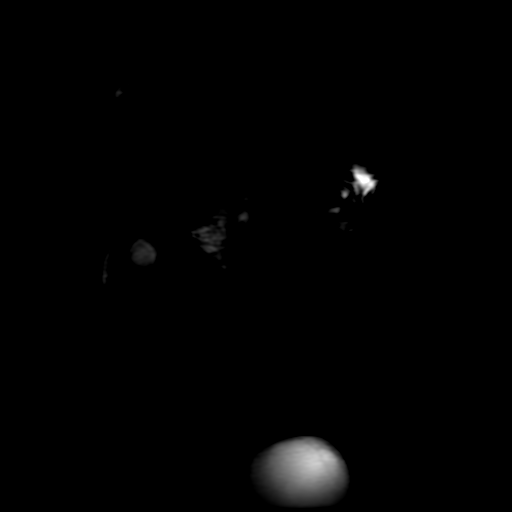

[Series 14: T1 dynamic post-contrast · coronal · 5.0mm · 0.78mm/px · 3 of 88 slices shown]
[im 1/88]
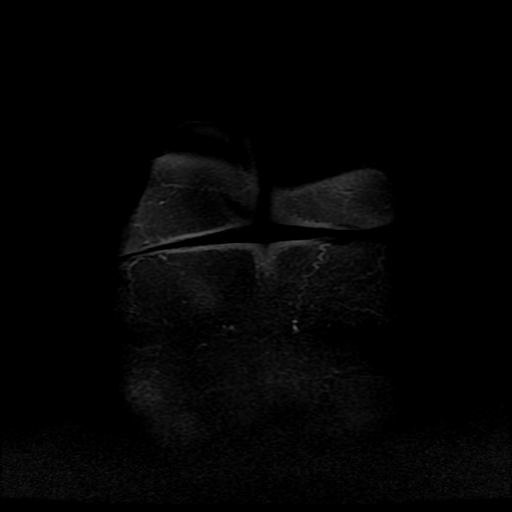
[im 44/88]
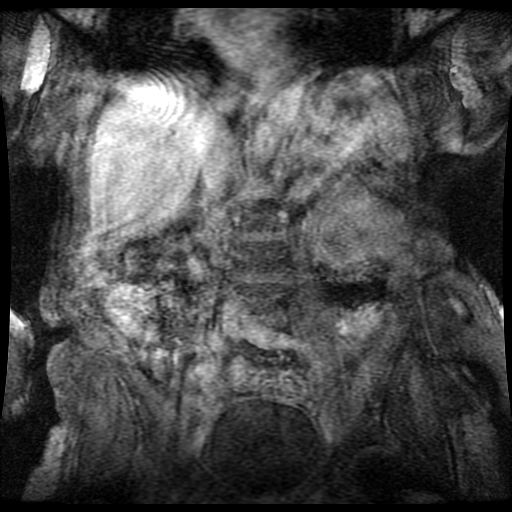
[im 88/88]
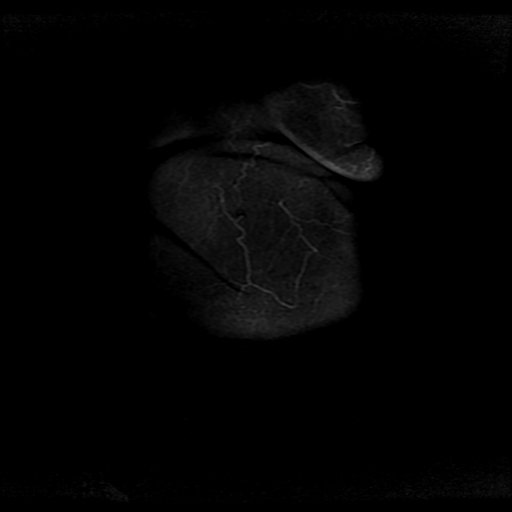

[Series 701: processed images · sagittal · 1.6mm · 0.62mm/px · 1 of 9 slices shown (1 of 3)]
[im 1/9]
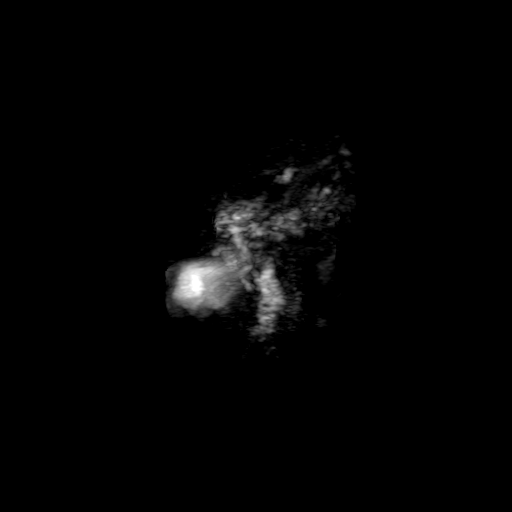

[Series 702: processed images · sagittal · 1.6mm · 0.62mm/px · 1 of 9 slices shown (2 of 3)]
[im 1/9]
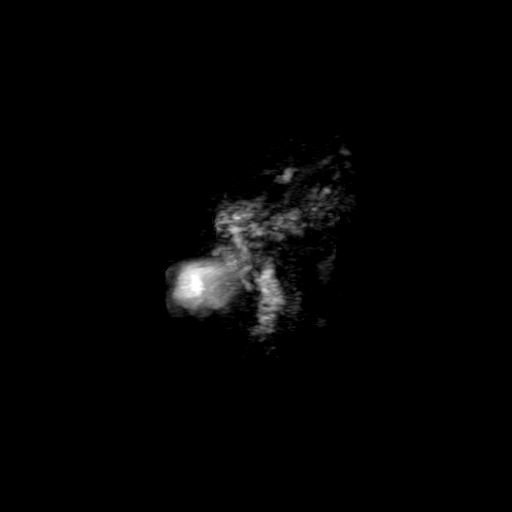

[Series 703: processed images · coronal · 1.6mm · 0.62mm/px · 1 of 6 slices shown (3 of 3)]
[im 1/6]
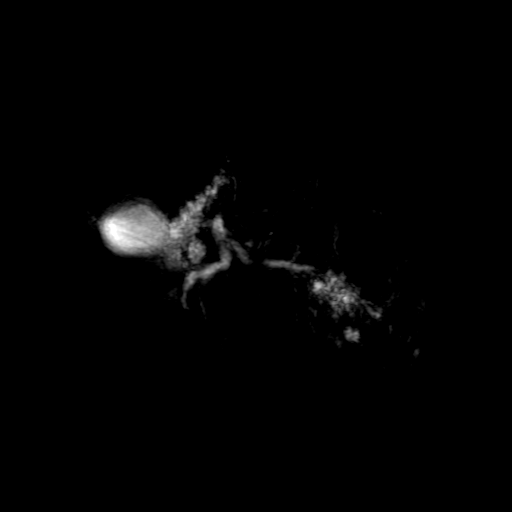

[Series 1300: T1 dynamic · axial · 5.0mm · 0.78mm/px · 1 of 88 slices shown]
[im 1/88]
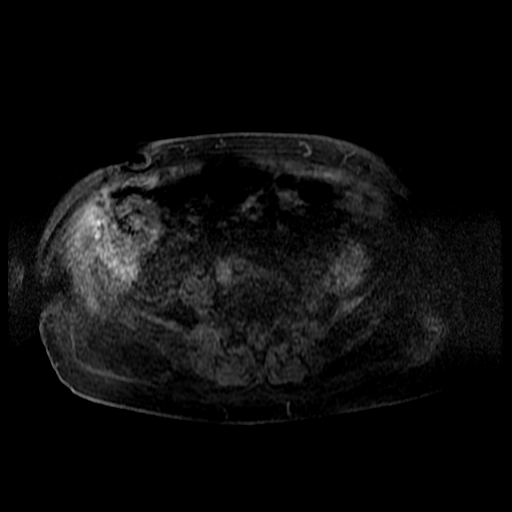

[19 of 48 positions shown; findings below may reference images not displayed]

FINDINGS: Exam degraded by respiratory motion.

Lower chest: Lung bases are clear.

Hepatobiliary: There is no intrahepatic duct dilatation. There is
mild dilatation of the common hepatic duct and common bile duct.
There is no obstructing lesion identified. No gallstones present. No
choledocholithiasis.

No enhancing lesion identified in the liver

Of note, the MRCP contrast-enhanced images are significantly
degraded by respiratory motion.

Pancreas: Pancreas divisum variant ductal anatomy. There is mild
dilatation of the pancreatic duct to 4 mm. There is a cystic change
in the pancreatic tail suggesting ductal ectasia (image 21, series
4). No fluid collection surrounding the pancreas. No pancreatic mass
lesion identified.

Spleen: Normal spleen

Adrenals/urinary tract: Adrenal glands are normal. Bosniak 1 renal
cyst of the RIGHT kidney

Stomach/Bowel: Stomach normal duodenum normal. Again demonstrated is
thickening of the transverse colon with edema.

Vascular/Lymphatic: Abdominal aortic normal caliber. No abdominal
adenopathy

Other: No free fluid.

Musculoskeletal: No aggressive osseous lesion.
IMPRESSION: 1. Pancreas divisum variant ductal anatomy. Mild dilatation of the
pancreatic duct and duct ectasia of the pancreatic tail. Favor
chronic benign changes of pancreas.
2. Mild dilatation of the common hepatic duct and common bile duct
without intrahepatic duct dilatation. Favor benign senescent
dilatation. No choledocholithiasis or pancreatic mass lesion
identified.
3. Inflammation involving the transverse colon described in CT
04/24/2017.
[DATE]. Exam significantly degraded by respiratory motion.

## 2018-08-13 ENCOUNTER — Other Ambulatory Visit: Payer: Self-pay | Admitting: Nurse Practitioner

## 2018-09-17 ENCOUNTER — Other Ambulatory Visit: Payer: Self-pay | Admitting: Nurse Practitioner

## 2018-10-15 ENCOUNTER — Other Ambulatory Visit: Payer: Self-pay | Admitting: Internal Medicine

## 2018-10-18 ENCOUNTER — Encounter: Payer: Self-pay | Admitting: Nurse Practitioner

## 2018-10-18 ENCOUNTER — Other Ambulatory Visit: Payer: Self-pay

## 2018-10-18 ENCOUNTER — Ambulatory Visit (INDEPENDENT_AMBULATORY_CARE_PROVIDER_SITE_OTHER): Payer: Medicare Other | Admitting: Nurse Practitioner

## 2018-10-18 VITALS — BP 132/72 | HR 73 | Temp 98.0°F | Resp 16 | Wt 128.0 lb

## 2018-10-18 DIAGNOSIS — G8929 Other chronic pain: Secondary | ICD-10-CM | POA: Diagnosis not present

## 2018-10-18 DIAGNOSIS — I10 Essential (primary) hypertension: Secondary | ICD-10-CM | POA: Diagnosis not present

## 2018-10-18 DIAGNOSIS — I4891 Unspecified atrial fibrillation: Secondary | ICD-10-CM

## 2018-10-18 DIAGNOSIS — H539 Unspecified visual disturbance: Secondary | ICD-10-CM | POA: Insufficient documentation

## 2018-10-18 DIAGNOSIS — M25561 Pain in right knee: Secondary | ICD-10-CM

## 2018-10-18 NOTE — Patient Instructions (Addendum)
   Your blood type is B Positive      If you have lab work done today you will be contacted with your lab results within the next 2 weeks.  If you have not heard from [place] then please contact us. The fastest way to get your results is to register for My Chart.   IF you received an x-ray today, you will receive an invoice from [facility]. Please contact [facility] at [phone] with questions or concerns regarding your invoice.   IF you received labwork today, you will receive an invoice from [place]. Please contact [facility] at [phone] with questions or concerns regarding your invoice.   Our billing staff will not be able to assist you with questions regarding bills from these companies.  You will be contacted with the lab results as soon as they are available. The fastest way to get your results is to activate your My Chart account. Instructions are located on the last page of this paperwork. If you have not heard from [place] regarding the results in 2 weeks, please contact this office.

## 2018-10-18 NOTE — Progress Notes (Addendum)
Subjective:     Patient ID: Amanda Rocha , adult    DOB: 1921-03-24 , 85 y.o.   MRN: [id number]   Chief Complaint  Patient presents with  . Hypertension    follow-up   . Medication Refill  . Knee Pain    right knee    HPI  Weight loss - she has loss 4 lbs since her last visit in March 2019  Hypertension  This is a chronic problem. The current episode started more than 1 year ago. The problem is unchanged. The problem is controlled. Pertinent negatives include no anxiety, chest pain, headaches, malaise/fatigue, palpitations or shortness of breath. Risk factors for coronary artery disease include sedentary lifestyle. Past treatments include ACE inhibitors, diuretics and calcium channel blockers. There are no compliance problems.  There is no history of chronic renal disease.  Medication Refill  This is a chronic problem. The current episode started more than 1 year ago. Associated symptoms include arthralgias (right knee pain). Pertinent negatives include no chest pain, fatigue, headaches, numbness or weakness. Nothing aggravates the symptoms.  Knee Pain   The incident occurred more than 1 week ago. There was no injury mechanism. The pain is present in the left knee and right knee. The pain is mild. The pain has been intermittent since onset. Pertinent negatives include no inability to bear weight, numbness or tingling. The symptoms are aggravated by movement. He has tried acetaminophen for the symptoms.     Past Medical History:  Diagnosis Date  . Arthritis    "knees" (04/25/2017)  . Colitis   . Hypertension   . New onset atrial fibrillation ([place]) 04/24/2017   Archie Endo 04/24/2017  . Syncope   . Uterine cancer ([place]) 1960s   "healed of it; no treatment"      Family History  Problem Relation Age of Onset  . Diabetes Mother   . Hypertension Father      Current Outpatient Medications:  .  amLODipine (NORVASC) 10 MG tablet, Take 1 tablet (10 mg total) by mouth daily., Disp: 30  tablet, Rfl: 1 .  aspirin EC 81 MG tablet, Take 81 mg by mouth daily., Disp: , Rfl:  .  ENSURE (ENSURE), Take 237 mLs by mouth daily., Disp: , Rfl:  .  lisinopril-hydrochlorothiazide (PRINZIDE,ZESTORETIC) 20-12.5 MG tablet, TAKE 1 TABLET EVERY DAY AND IF BP>140/90 IN EVENING, TAKE 2ND TABLET, Disp: 60 tablet, Rfl: 0 .  ondansetron (ZOFRAN) 4 MG tablet, Take 1 tablet (4 mg total) by mouth every 6 (six) hours as needed for nausea., Disp: 20 tablet, Rfl: 0 .  pantoprazole (PROTONIX) 40 MG tablet, Take 1 tablet (40 mg total) by mouth daily., Disp: 10 tablet, Rfl: 0 .  SENNA PO, Take 1 Package by mouth as needed. Senna leaves tea, Disp: , Rfl:    No Known Allergies   Review of Systems  Constitutional: Negative.  Negative for fatigue and malaise/fatigue.  Eyes: Negative for visual disturbance.  Respiratory: Negative.  Negative for shortness of breath.   Cardiovascular: Negative.  Negative for chest pain, palpitations and leg swelling.  Gastrointestinal: Negative.   Endocrine: Negative.   Musculoskeletal: Positive for arthralgias (right knee pain).  Skin: Negative.   Neurological: Negative for dizziness, tingling, weakness, numbness and headaches.  Psychiatric/Behavioral: Negative for confusion. The patient is not nervous/anxious.      Today's Vitals   10/18/18 1127  BP: 132/72  Pulse: 73  Resp: 16  Temp: 98 F (36.7 C)  TempSrc: Oral  SpO2: 94%  Weight:  128 lb (58.1 kg)   Body mass index is 22.67 kg/m.   Objective:  Physical Exam Vitals signs reviewed.  Constitutional:      Appearance: Normal appearance. He is well-developed.  HENT:     Head: Normocephalic and atraumatic.  Eyes:     Pupils: Pupils are equal, round, and reactive to light.  Cardiovascular:     Rate and Rhythm: Normal rate and regular rhythm.     Pulses: Normal pulses.     Heart sounds: Normal heart sounds. No murmur.  Pulmonary:     Effort: Pulmonary effort is normal.     Breath sounds: Normal breath  sounds.  Musculoskeletal: Normal range of motion.        General: No swelling, tenderness, deformity or signs of injury.     Left lower leg: No edema.  Skin:    General: Skin is warm and dry.     Capillary Refill: Capillary refill takes less than 2 seconds.  Neurological:     General: No focal deficit present.     Mental Status: He is alert and oriented to person, place, and time.     Cranial Nerves: No cranial nerve deficit.  Psychiatric:        Mood and Affect: Mood normal.         Assessment And Plan:     1. Essential hypertension . B/P is controlled.  . CMP ordered to check renal function.  . The importance of regular exercise and dietary modification was stressed to the patient.  . Stressed importance of losing ten percent of her body weight to help with B/P control.  . The weight loss would help with decreasing cardiac and cancer risk as well.  - BMP8+eGFR - Lipid Profile  2. Atrial fibrillation, unspecified type (HCC)  Chronic, controlled  Continue with current medications  3. Chronic pain of right knee  Wants to hold off on cream from [place].      Minette Brine, FNP

## 2018-10-19 LAB — LIPID PANEL
CHOLESTEROL TOTAL: 208 mg/dL — AB (ref 100–199)
Chol/HDL Ratio: 2.7 ratio (ref 0.0–4.4)
HDL: 78 mg/dL (ref >39)
LDL Calculated: 115 mg/dL — ABNORMAL HIGH (ref 0–99)
TRIGLYCERIDES: 74 mg/dL (ref 0–149)
VLDL Cholesterol Cal: 15 mg/dL (ref 5–40)

## 2018-10-19 LAB — BMP8+EGFR
BUN / CREAT RATIO: 19 (ref 12–28)
BUN: 15 mg/dL (ref 10–36)
CALCIUM: 9.8 mg/dL (ref 8.7–10.3)
CHLORIDE: 99 mmol/L (ref 96–106)
CO2: 27 mmol/L (ref 20–29)
Creatinine, Ser: 0.81 mg/dL (ref 0.57–1.00)
GFR calc Af Amer: 71 mL/min/{1.73_m2} (ref >59)
GFR calc non Af Amer: 62 mL/min/{1.73_m2} (ref >59)
GLUCOSE: 84 mg/dL (ref 65–99)
Potassium: 4.8 mmol/L (ref 3.5–5.2)
Sodium: 142 mmol/L (ref 134–144)

## 2018-11-18 ENCOUNTER — Other Ambulatory Visit: Payer: Self-pay | Admitting: Internal Medicine

## 2018-11-20 ENCOUNTER — Telehealth: Payer: Self-pay | Admitting: Internal Medicine

## 2018-11-20 NOTE — Telephone Encounter (Signed)
Called to Reschedule Medicare Annual Wellness Visit with the Nurse Health Advisor and Office Visit with Minette Brine, FNP.  Original Wellness Visit is 12/27/2018 at 2:00 PM with Minette Brine, FNP.  If patient returns call, please note: their last AWV was on 12/21/2017, please schedule AWV with [place] Office Visit with Minette Brine, FNP any date AFTER 12/22/2018.  Thank you! For any questions please contact: Janace Hoard at [phone] or Skype [email or web address]

## 2018-11-21 NOTE — Telephone Encounter (Signed)
Called to Reschedule Medicare Annual Wellness Visit with the Nurse Health Advisor and Office Visit with Minette Brine, FNP.  Original Wellness Visit is 12/27/2018 at 2:00 PM with Minette Brine, FNP.  If patient returns call, please note: their last AWV was on 12/21/2017, please schedule AWV with [place] Office Visit with Minette Brine, FNP any date AFTER 12/22/2018.  Thank you! For any questions please contact: Janace Hoard at [phone] or Skype [email or web address]

## 2018-11-25 NOTE — Telephone Encounter (Signed)
Spoke with patient and scheduled AWV-s on 01/09/2019 at 2:00 PM and OV with Minette Brine, FNP at 2:45 PM.  Amanda Rocha, Care Guide.

## 2018-11-27 ENCOUNTER — Ambulatory Visit (INDEPENDENT_AMBULATORY_CARE_PROVIDER_SITE_OTHER): Payer: Medicare Other | Admitting: Nurse Practitioner

## 2018-11-27 ENCOUNTER — Encounter: Payer: Self-pay | Admitting: Nurse Practitioner

## 2018-11-27 VITALS — BP 130/70 | HR 75 | Ht 63.0 in | Wt 128.2 lb

## 2018-11-27 DIAGNOSIS — M25552 Pain in left hip: Secondary | ICD-10-CM

## 2018-11-27 DIAGNOSIS — M545 Low back pain, unspecified: Secondary | ICD-10-CM

## 2018-11-27 DIAGNOSIS — G8929 Other chronic pain: Secondary | ICD-10-CM

## 2018-11-27 MED ORDER — TRIAMCINOLONE ACETONIDE 40 MG/ML IJ SUSP
60.0000 mg | Freq: Once | INTRAMUSCULAR | Status: AC
  Administered 2018-11-27: 60 mg via INTRAMUSCULAR

## 2018-11-27 NOTE — Progress Notes (Signed)
Subjective:     Patient ID: Amanda Rocha , adult    DOB: 07/27/1921 , 85 y.o.   MRN: [id number]   Chief Complaint  Patient presents with  . Hip Pain    lower back going in right hip , pain in both knee, using ice and heat, taking tyleonl , more pain rt hip , no falls, no injury to hip or knees     HPI  Hip Pain   There was no injury mechanism. The pain is present in the left hip, left knee and right knee. The quality of the pain is described as aching. The pain has been constant since onset. Pertinent negatives include no inability to bear weight, numbness or tingling. He has tried acetaminophen, heat and rest for the symptoms.     Past Medical History:  Diagnosis Date  . Arthritis    "knees" (04/25/2017)  . Colitis   . Hypertension   . New onset atrial fibrillation ([place]) 04/24/2017   Archie Endo 04/24/2017  . Syncope   . Uterine cancer ([place]) 1960s   "healed of it; no treatment"      Family History  Problem Relation Age of Onset  . Diabetes Mother   . Hypertension Father      Current Outpatient Medications:  .  amLODipine (NORVASC) 10 MG tablet, Take 1 tablet (10 mg total) by mouth daily., Disp: 30 tablet, Rfl: 1 .  aspirin EC 81 MG tablet, Take 81 mg by mouth daily., Disp: , Rfl:  .  ENSURE (ENSURE), Take 237 mLs by mouth daily., Disp: , Rfl:  .  lisinopril-hydrochlorothiazide (PRINZIDE,ZESTORETIC) 20-12.5 MG tablet, TAKE 1 TABLET EVERY DAY AND IF BP>140/90 IN EVENING, TAKE 2ND TABLET, Disp: 60 tablet, Rfl: 0 .  pantoprazole (PROTONIX) 40 MG tablet, Take 1 tablet (40 mg total) by mouth daily., Disp: 10 tablet, Rfl: 0 .  SENNA PO, Take 1 Package by mouth as needed. Senna leaves tea, Disp: , Rfl:    No Known Allergies   Review of Systems  Constitutional: Negative for fatigue.  Respiratory: Negative for cough.   Musculoskeletal: Positive for arthralgias (MAINLY LEFT HIP), back pain, gait problem (SLOW GAIT), joint swelling and myalgias.  Neurological: Negative for  dizziness, tingling and numbness.     Today's Vitals   11/27/18 1212  BP: 130/70  Pulse: 75  SpO2: 96%  Weight: 128 lb 3.2 oz (58.2 kg)  Height: 5\' 3"  (1.6 m)   Body mass index is 22.71 kg/m.   Objective:  Physical Exam Vitals signs reviewed.  Constitutional:      Appearance: Normal appearance.  Cardiovascular:     Rate and Rhythm: Normal rate and regular rhythm.  Skin:    General: Skin is warm and dry.  Neurological:     General: No focal deficit present.     Mental Status: He is alert.  Psychiatric:        Mood and Affect: Mood normal.        Behavior: Behavior normal.        Thought Content: Thought content normal.        Judgment: Judgment normal.         Assessment And Plan:     1. Left hip pain  Limited range of motion  Will treat with kenalog injection  - Ambulatory referral to [place] - triamcinolone acetonide (KENALOG-40) injection 60 mg  2. Chronic bilateral low back pain without sciatica  This is causing her to be uncomfortable  Hopefully the steroid  injection will help  Also sent rx for pain cream at [facility] - triamcinolone acetonide (KENALOG-40) injection 60 mg       Minette Brine, FNP

## 2018-12-15 ENCOUNTER — Encounter: Payer: Self-pay | Admitting: Nurse Practitioner

## 2018-12-27 ENCOUNTER — Ambulatory Visit: Payer: Self-pay | Admitting: Nurse Practitioner

## 2019-01-09 ENCOUNTER — Ambulatory Visit: Payer: Self-pay

## 2019-01-09 ENCOUNTER — Other Ambulatory Visit: Payer: Self-pay

## 2019-01-09 ENCOUNTER — Ambulatory Visit (INDEPENDENT_AMBULATORY_CARE_PROVIDER_SITE_OTHER): Payer: Medicare Other | Admitting: Nurse Practitioner

## 2019-01-09 DIAGNOSIS — E2839 Other primary ovarian failure: Secondary | ICD-10-CM | POA: Diagnosis not present

## 2019-01-09 DIAGNOSIS — I10 Essential (primary) hypertension: Secondary | ICD-10-CM | POA: Diagnosis not present

## 2019-01-09 DIAGNOSIS — M25552 Pain in left hip: Secondary | ICD-10-CM | POA: Diagnosis not present

## 2019-01-09 MED ORDER — LISINOPRIL-HYDROCHLOROTHIAZIDE 20-12.5 MG PO TABS: ORAL_TABLET | ORAL | 0 refills | Status: DC

## 2019-01-09 NOTE — Progress Notes (Signed)
Virtual Visit via Telephone Note    This visit type was conducted due to national recommendations for restrictions regarding the COVID-19 Pandemic (e.g. social distancing).  This format is felt to be most appropriate for this patient at this time.  All issues noted in this document were discussed and addressed.  No physical exam was performed (except for noted visual exam findings with Video Visits).  Please refer to the patient's chart (MyChart message for video visits and phone note for telephone visits) for the patient's consent to telehealth for [facility].  Date:  01/09/2019   ID:  Amanda Rocha, DOB 1921-08-13, MRN [id number]  Patient Location:  Home - spoke with Amanda Rocha via phone  Provider location:   Office    Chief Complaint:  Hypertension follow up  History of Present Illness:    Amanda Rocha is a 85 y.o. female who presents via video conferencing for a telehealth visit today.    The patient does not have symptoms concerning for COVID-19 infection (fever, chills, cough, or new shortness of breath).   Hypertension  This is a chronic problem. The current episode started more than 1 year ago. The problem is unchanged. The problem is controlled. Pertinent negatives include no anxiety, chest pain, headaches, malaise/fatigue, palpitations or shortness of breath. Risk factors for coronary artery disease include sedentary lifestyle. Past treatments include ACE inhibitors, diuretics and calcium channel blockers. The current treatment provides moderate improvement. There are no compliance problems.  There is no history of angina. There is no history of chronic renal disease.  Medication Refill  This is a chronic problem. The current episode started more than 1 year ago. Associated symptoms include arthralgias (right knee pain). Pertinent negatives include no chest pain, fatigue, headaches, numbness or weakness. Nothing aggravates the symptoms.  Hip Pain   The incident  occurred more than 1 week ago. There was no injury mechanism. The pain is present in the left hip (left and right hip). The quality of the pain is described as aching. The pain is mild. The pain has been intermittent since onset. Pertinent negatives include no numbness or tingling. The symptoms are aggravated by movement. Treatments tried: pain cream  The treatment provided no relief.      Past Medical History:  Diagnosis Date  . Arthritis    "knees" (04/25/2017)  . Colitis   . Hypertension   . New onset atrial fibrillation ([place]) 04/24/2017   Archie Endo 04/24/2017  . Syncope   . Uterine cancer ([place]) 1960s   "healed of it; no treatment"    Past Surgical History:  Procedure Laterality Date  . JOINT REPLACEMENT    . TOTAL KNEE ARTHROPLASTY Left 07/2007     Current Meds  Medication Sig  . aspirin EC 81 MG tablet Take 81 mg by mouth daily.  Marland Kitchen ENSURE (ENSURE) Take 237 mLs by mouth daily.  . [DISCONTINUED] lisinopril-hydrochlorothiazide (PRINZIDE,ZESTORETIC) 20-12.5 MG tablet TAKE 1 TABLET EVERY DAY AND IF BP>140/90 IN EVENING, TAKE 2ND TABLET     Allergies:   Patient has no known allergies.   Social History   Tobacco Use  . Smoking status: Never Smoker  . Smokeless tobacco: Never Used  Substance Use Topics  . Alcohol use: No  . Drug use: No     Family Hx: The patient's family history includes Diabetes in her mother; Hypertension in her father.  ROS:   Please see the history of present illness.    Review of Systems  Constitutional: Negative for fatigue and  malaise/fatigue.  Respiratory: Negative for shortness of breath.   Cardiovascular: Negative for chest pain and palpitations.  Musculoskeletal: Positive for arthralgias (right knee pain), back pain and joint pain.  Neurological: Negative for dizziness, tingling, weakness, numbness and headaches.  Psychiatric/Behavioral: Negative for depression.    All other systems reviewed and are negative.   Labs/Other Tests and Data  Reviewed:    Recent Labs: 10/18/2018: BUN 15; Creatinine, Ser 0.81; Potassium 4.8; Sodium 142   Recent Lipid Panel Lab Results  Component Value Date/Time   CHOL 208 (H) 10/18/2018 12:18 PM   TRIG 74 10/18/2018 12:18 PM   HDL 78 10/18/2018 12:18 PM   CHOLHDL 2.7 10/18/2018 12:18 PM   LDLCALC 115 (H) 10/18/2018 12:18 PM    Wt Readings from Last 3 Encounters:  11/27/18 128 lb 3.2 oz (58.2 kg)  10/18/18 128 lb (58.1 kg)  04/29/17 131 lb 14.4 oz (59.8 kg)     Exam:    Vital Signs:  There were no vitals taken for this visit.    Physical Exam  Constitutional: She is oriented to person, place, and time.  Pulmonary/Chest: Effort normal.  Neurological: She is alert and oriented to person, place, and time.  Psychiatric: Mood, memory, affect and judgment normal.      ASSESSMENT & PLAN:    1. Essential hypertension  She was unable to check her blood pressure at home  Advised to make sure she is taking her medications daily and to avoid high salt foods  2. Left hip pain  This is chronic she is now using a pain cream from [place]  3. Decreased estrogen level  She is due for a bone density exam - DG Bone Density; Future    COVID-19 Education: The signs and symptoms of COVID-19 were discussed with the patient and how to seek care for testing (follow up with PCP or arrange E-visit).  The importance of social distancing was discussed today.  Patient Risk:   After full review of this patients clinical status, I feel that they are at least moderate risk at this time.  Time:   Today, I have spent 12.40 minutes with the patient with telehealth technology discussing above diagnoses     Medication Adjustments/Labs and Tests Ordered:  Current medicines are reviewed at length with the patient today.  Concerns regarding medicines are outlined above.  Tests Ordered: No orders of the defined types were placed in this encounter.  Medication Changes: No orders of the  defined types were placed in this encounter.   Disposition:  Follow up in 3 month(s)  Signed, Minette Brine, FNP

## 2019-01-15 ENCOUNTER — Telehealth: Payer: Self-pay | Admitting: Internal Medicine

## 2019-01-15 NOTE — Telephone Encounter (Signed)
I left a message asking the patient to call me at [phone] about AWV appointment. VDM (DD)

## 2019-01-21 ENCOUNTER — Ambulatory Visit: Payer: Self-pay

## 2019-01-23 ENCOUNTER — Other Ambulatory Visit: Payer: Self-pay

## 2019-01-23 MED ORDER — LISINOPRIL-HYDROCHLOROTHIAZIDE 20-12.5 MG PO TABS: ORAL_TABLET | ORAL | 0 refills | Status: DC

## 2019-01-29 ENCOUNTER — Other Ambulatory Visit: Payer: Self-pay

## 2019-01-29 MED ORDER — LISINOPRIL-HYDROCHLOROTHIAZIDE 20-12.5 MG PO TABS: ORAL_TABLET | ORAL | 0 refills | Status: DC

## 2019-02-09 ENCOUNTER — Encounter: Payer: Self-pay | Admitting: Nurse Practitioner

## 2019-02-09 DIAGNOSIS — E2839 Other primary ovarian failure: Secondary | ICD-10-CM | POA: Insufficient documentation

## 2019-02-09 DIAGNOSIS — M25551 Pain in right hip: Secondary | ICD-10-CM | POA: Insufficient documentation

## 2019-02-11 ENCOUNTER — Telehealth: Payer: Self-pay | Admitting: Internal Medicine

## 2019-02-11 NOTE — Telephone Encounter (Signed)
I left a message asking the patient to call me at [phone] to schedule telephonic AWV visit if interested. VDM (DD)

## 2019-02-18 NOTE — Telephone Encounter (Signed)
I spoke with the patient and she agreed to telephone visit on 02/25/2019 at 2:00.

## 2019-02-25 ENCOUNTER — Other Ambulatory Visit: Payer: Self-pay

## 2019-02-25 ENCOUNTER — Ambulatory Visit: Payer: Medicare Other

## 2019-02-25 ENCOUNTER — Telehealth: Payer: Self-pay

## 2019-02-25 NOTE — Telephone Encounter (Signed)
This nurse called patient three tines in order to perform The telephone AWV. Three messages were left. Encouraged her to call us back in order to reschedule appointment.

## 2019-03-05 ENCOUNTER — Telehealth: Payer: Self-pay

## 2019-03-05 NOTE — Telephone Encounter (Signed)
This nurse attempted to call patient in order to schedule an AWV. Message was left for patient to call back in order to schedule.

## 2019-03-19 ENCOUNTER — Ambulatory Visit (INDEPENDENT_AMBULATORY_CARE_PROVIDER_SITE_OTHER): Payer: Medicare Other

## 2019-03-19 ENCOUNTER — Other Ambulatory Visit: Payer: Self-pay

## 2019-03-19 VITALS — Ht 63.0 in | Wt 125.0 lb

## 2019-03-19 DIAGNOSIS — Z0000 Encounter for general adult medical examination without abnormal findings: Secondary | ICD-10-CM

## 2019-03-19 NOTE — Patient Instructions (Signed)
Amanda Rocha , Thank you for taking time to come for your Medicare Wellness Visit. I appreciate your ongoing commitment to your health goals. Please review the following plan we discussed and let me know if I can assist you in the future.   Screening recommendations/referrals: Colonoscopy: not required Mammogram: not required Bone Density: ordered 12/2018 Recommended yearly ophthalmology/optometry visit for glaucoma screening and checkup Recommended yearly dental visit for hygiene and checkup  Vaccinations: Influenza vaccine: declines Pneumococcal vaccine: declines Tdap vaccine: declines Shingles vaccine: discussed    Advanced directives: Advance directive discussed with you today. Even though you declined this today please call our office should you change your mind and we can give you the proper paperwork for you to fill out.   Conditions/risks identified: none  Next appointment:    Preventive Care 71 Years and Older, Female Preventive care refers to lifestyle choices and visits with your health care provider that can promote health and wellness. What does preventive care include?  A yearly physical exam. This is also called an annual well check.  Dental exams once or twice a year.  Routine eye exams. Ask your health care provider how often you should have your eyes checked.  Personal lifestyle choices, including:  Daily care of your teeth and gums.  Regular physical activity.  Eating a healthy diet.  Avoiding tobacco and drug use.  Limiting alcohol use.  Practicing safe sex.  Taking low-dose aspirin every day.  Taking vitamin and mineral supplements as recommended by your health care provider. What happens during an annual well check? The services and screenings done by your health care provider during your annual well check will depend on your age, overall health, lifestyle risk factors, and family history of disease. Counseling  Your health care provider may  ask you questions about your:  Alcohol use.  Tobacco use.  Drug use.  Emotional well-being.  Home and relationship well-being.  Sexual activity.  Eating habits.  History of falls.  Memory and ability to understand (cognition).  Work and work Statistician.  Reproductive health. Screening  You may have the following tests or measurements:  Height, weight, and BMI.  Blood pressure.  Lipid and cholesterol levels. These may be checked every 5 years, or more frequently if you are over 3 years old.  Skin check.  Lung cancer screening. You may have this screening every year starting at age 42 if you have a 30-pack-year history of smoking and currently smoke or have quit within the past 15 years.  Fecal occult blood test (FOBT) of the stool. You may have this test every year starting at age 55.  Flexible sigmoidoscopy or colonoscopy. You may have a sigmoidoscopy every 5 years or a colonoscopy every 10 years starting at age 49.  Hepatitis C blood test.  Hepatitis B blood test.  Sexually transmitted disease (STD) testing.  Diabetes screening. This is done by checking your blood sugar (glucose) after you have not eaten for a while (fasting). You may have this done every 1-3 years.  Bone density scan. This is done to screen for osteoporosis. You may have this done starting at age 47.  Mammogram. This may be done every 1-2 years. Talk to your health care provider about how often you should have regular mammograms. Talk with your health care provider about your test results, treatment options, and if necessary, the need for more tests. Vaccines  Your health care provider may recommend certain vaccines, such as:  Influenza vaccine. This is recommended  every year.  Tetanus, diphtheria, and acellular pertussis (Tdap, Td) vaccine. You may need a Td booster every 10 years.  Zoster vaccine. You may need this after age 32.  Pneumococcal 13-valent conjugate (PCV13) vaccine. One  dose is recommended after age 56.  Pneumococcal polysaccharide (PPSV23) vaccine. One dose is recommended after age 84. Talk to your health care provider about which screenings and vaccines you need and how often you need them. This information is not intended to replace advice given to you by your health care provider. Make sure you discuss any questions you have with your health care provider. Document Released: 10/08/2015 Document Revised: 05/31/2016 Document Reviewed: 07/13/2015 Elsevier Interactive Patient Education  2017 [place] Prevention in the Home Falls can cause injuries. They can happen to people of all ages. There are many things you can do to make your home safe and to help prevent falls. What can I do on the outside of my home?  Regularly fix the edges of walkways and driveways and fix any cracks.  Remove anything that might make you trip as you walk through a door, such as a raised step or threshold.  Trim any bushes or trees on the path to your home.  Use bright outdoor lighting.  Clear any walking paths of anything that might make someone trip, such as rocks or tools.  Regularly check to see if handrails are loose or broken. Make sure that both sides of any steps have handrails.  Any raised decks and porches should have guardrails on the edges.  Have any leaves, snow, or ice cleared regularly.  Use sand or salt on walking paths during winter.  Clean up any spills in your garage right away. This includes oil or grease spills. What can I do in the bathroom?  Use night lights.  Install grab bars by the toilet and in the tub and shower. Do not use towel bars as grab bars.  Use non-skid mats or decals in the tub or shower.  If you need to sit down in the shower, use a plastic, non-slip stool.  Keep the floor dry. Clean up any water that spills on the floor as soon as it happens.  Remove soap buildup in the tub or shower regularly.  Attach bath  mats securely with double-sided non-slip rug tape.  Do not have throw rugs and other things on the floor that can make you trip. What can I do in the bedroom?  Use night lights.  Make sure that you have a light by your bed that is easy to reach.  Do not use any sheets or blankets that are too big for your bed. They should not hang down onto the floor.  Have a firm chair that has side arms. You can use this for support while you get dressed.  Do not have throw rugs and other things on the floor that can make you trip. What can I do in the kitchen?  Clean up any spills right away.  Avoid walking on wet floors.  Keep items that you use a lot in easy-to-reach places.  If you need to reach something above you, use a strong step stool that has a grab bar.  Keep electrical cords out of the way.  Do not use floor polish or wax that makes floors slippery. If you must use wax, use non-skid floor wax.  Do not have throw rugs and other things on the floor that can make you trip. What  can I do with my stairs?  Do not leave any items on the stairs.  Make sure that there are handrails on both sides of the stairs and use them. Fix handrails that are broken or loose. Make sure that handrails are as long as the stairways.  Check any carpeting to make sure that it is firmly attached to the stairs. Fix any carpet that is loose or worn.  Avoid having throw rugs at the top or bottom of the stairs. If you do have throw rugs, attach them to the floor with carpet tape.  Make sure that you have a light switch at the top of the stairs and the bottom of the stairs. If you do not have them, ask someone to add them for you. What else can I do to help prevent falls?  Wear shoes that:  Do not have high heels.  Have rubber bottoms.  Are comfortable and fit you well.  Are closed at the toe. Do not wear sandals.  If you use a stepladder:  Make sure that it is fully opened. Do not climb a closed  stepladder.  Make sure that both sides of the stepladder are locked into place.  Ask someone to hold it for you, if possible.  Clearly mark and make sure that you can see:  Any grab bars or handrails.  First and last steps.  Where the edge of each step is.  Use tools that help you move around (mobility aids) if they are needed. These include:  Canes.  Walkers.  Scooters.  Crutches.  Turn on the lights when you go into a dark area. Replace any light bulbs as soon as they burn out.  Set up your furniture so you have a clear path. Avoid moving your furniture around.  If any of your floors are uneven, fix them.  If there are any pets around you, be aware of where they are.  Review your medicines with your doctor. Some medicines can make you feel dizzy. This can increase your chance of falling. Ask your doctor what other things that you can do to help prevent falls. This information is not intended to replace advice given to you by your health care provider. Make sure you discuss any questions you have with your health care provider. Document Released: 07/08/2009 Document Revised: 02/17/2016 Document Reviewed: 10/16/2014 Elsevier Interactive Patient Education  2017 [employer].

## 2019-03-19 NOTE — Progress Notes (Signed)
Subjective:   Amanda Rocha is a 85 y.o. female who presents for Medicare Annual (Subsequent) preventive examination.  Review of Systems:  n/a Cardiac Risk Factors include: advanced age (>83men, >21 women);hypertension;sedentary lifestyle     Objective:     Vitals: Ht 5\' 3"  (1.6 m) Comment: per patient  Wt 125 lb (56.7 kg) Comment: per patient  BMI 22.14 kg/m   Body mass index is 22.14 kg/m.  Advanced Directives 03/19/2019 04/25/2017 04/24/2017  Does Patient Have a Medical Advance Directive? No No No  Would patient like information on creating a medical advance directive? No - Patient declined No - Patient declined -    Tobacco Social History   Tobacco Use  Smoking Status Never Smoker  Smokeless Tobacco Never Used     Counseling given: Not Answered   Clinical Intake:  Pre-visit preparation completed: Yes  Pain : 0-10 Pain Score: 5  Pain Type: Chronic pain Pain Location: Hip Pain Orientation: Right Pain Radiating Towards: to knee Pain Descriptors / Indicators: Aching Pain Onset: More than a month ago Pain Frequency: Intermittent Pain Relieving Factors: hot baths and cream help some  Pain Relieving Factors: hot baths and cream help some  Nutritional Status: BMI of 19-24  Normal Nutritional Risks: None Diabetes: No  How often do you need to have someone help you when you read instructions, pamphlets, or other written materials from your doctor or pharmacy?: 1 - Never What is the last grade level you completed in school?: 11th grade  Interpreter Needed?: No  Information entered by :: NAllen LPN  Past Medical History:  Diagnosis Date  . Arthritis    "knees" (04/25/2017)  . Colitis   . Hypertension   . New onset atrial fibrillation ([place]) 04/24/2017   Archie Endo 04/24/2017  . Syncope   . Uterine cancer ([place]) 1960s   "healed of it; no treatment"    Past Surgical History:  Procedure Laterality Date  . JOINT REPLACEMENT    . TOTAL KNEE ARTHROPLASTY Left  07/2007   Family History  Problem Relation Age of Onset  . Diabetes Mother   . Hypertension Father    Social History   Socioeconomic History  . Marital status: Widowed    Spouse name: Not on file  . Number of children: Not on file  . Years of education: Not on file  . Highest education level: Not on file  Occupational History  . Occupation: retired  Scientific laboratory technician  . Financial resource strain: Not hard at all  . Food insecurity    Worry: Never true    Inability: Never true  . Transportation needs    Medical: No    Non-medical: No  Tobacco Use  . Smoking status: Never Smoker  . Smokeless tobacco: Never Used  Substance and Sexual Activity  . Alcohol use: Never    Frequency: Never  . Drug use: Never  . Sexual activity: Not Currently  Lifestyle  . Physical activity    Days per week: 0 days    Minutes per session: 0 min  . Stress: Not at all  Relationships  . Social Herbalist on phone: Not on file    Gets together: Not on file    Attends religious service: Not on file    Active member of club or organization: Not on file    Attends meetings of clubs or organizations: Not on file    Relationship status: Not on file  Other Topics Concern  . Not on  file  Social History Narrative  . Not on file    Outpatient Encounter Medications as of 03/19/2019  Medication Sig  . aspirin EC 81 MG tablet Take 81 mg by mouth daily.  Marland Kitchen lisinopril-hydrochlorothiazide (ZESTORETIC) 20-12.5 MG tablet TAKE 1 TABLET EVERY DAY  . SENNA PO Take 1 Package by mouth as needed. Senna leaves tea  . amLODipine (NORVASC) 10 MG tablet Take 1 tablet (10 mg total) by mouth daily. (Patient not taking: Reported on 03/19/2019)  . ENSURE (ENSURE) Take 237 mLs by mouth daily.  . pantoprazole (PROTONIX) 40 MG tablet Take 1 tablet (40 mg total) by mouth daily. (Patient not taking: Reported on 01/09/2019)   No facility-administered encounter medications on file as of 03/19/2019.     Activities of  Daily Living In your present state of health, do you have any difficulty performing the following activities: 03/19/2019  Hearing? N  Vision? Y  Comment needs new glasses  Difficulty concentrating or making decisions? Y  Comment some forgetfulness at times  Walking or climbing stairs? Y  Comment due to knees  Dressing or bathing? N  Doing errands, shopping? Y  Comment someone is with  Preparing Food and eating ? N  Using the Toilet? N  In the past six months, have you accidently leaked urine? Y  Comment wears depends  Do you have problems with loss of bowel control? N  Managing your Medications? N  Managing your Finances? N  Housekeeping or managing your Housekeeping? Y  Comment has an aide that comes in  Some recent data might be hidden    Patient Care Team: Glendale Chard, MD as PCP - General (Internal Medicine)    Assessment:   This is a routine wellness examination for [place].  Exercise Activities and Dietary recommendations Current Exercise Habits: The patient does not participate in regular exercise at present  Goals    . Gain weight     Wants to weigh 140 pounds       Fall Risk Fall Risk  03/19/2019 01/09/2019 10/18/2018  Falls in the past year? 0 0 0  Injury with Fall? - - 0  Risk for fall due to : Medication side effect - -  Follow up Falls evaluation completed;Falls prevention discussed - -   Is the patient's home free of loose throw rugs in walkways, pet beds, electrical cords, etc?   yes      Grab bars in the bathroom? yes      Handrails on the stairs?   yes      Adequate lighting?   yes  Timed Get Up and Go performed: n/a  Depression Screen PHQ 2/9 Scores 03/19/2019 01/09/2019  PHQ - 2 Score 0 0  PHQ- 9 Score 0 -     Cognitive Function     6CIT Screen 03/19/2019  What Year? 0 points  What month? 0 points  What time? 0 points  Count back from 20 0 points  Months in reverse 0 points  Repeat phrase 0 points  Total Score 0     There is no  immunization history on file for this patient.  Qualifies for Shingles Vaccine? yes  Screening Tests Health Maintenance  Topic Date Due  . DEXA SCAN  06/02/1988  . PNA vac Low Risk Adult (1 of 2 - PCV13) 01/09/2020 (Originally 06/02/1988)  . TETANUS/TDAP  03/18/2020 (Originally [birth year]02/1940)  . INFLUENZA VACCINE  04/26/2019    Cancer Screenings: Lung: Low Dose CT Chest recommended if Age 23-80  years, 30 pack-year currently smoking OR have quit w/in 15years. Patient does not qualify. Breast:  Up to date on Mammogram? Yes   Up to date of Bone Density/Dexa? Yes Colorectal: not required  Additional Screenings: : Hepatitis C Screening: n/a     Plan:    6 CIT was normal. Wants to gain weight. Declines vaccines   I have personally reviewed and noted the following in the patient's chart:   . Medical and social history . Use of alcohol, tobacco or illicit drugs  . Current medications and supplements . Functional ability and status . Nutritional status . Physical activity . Advanced directives . List of other physicians . Hospitalizations, surgeries, and ER visits in previous 12 months . Vitals . Screenings to include cognitive, depression, and falls . Referrals and appointments  In addition, I have reviewed and discussed with patient certain preventive protocols, quality metrics, and best practice recommendations. A written personalized care plan for preventive services as well as general preventive health recommendations were provided to patient.     Kellie Simmering, LPN  5/50/1586

## 2019-03-25 NOTE — Progress Notes (Signed)
This visit type was conducted due to national recommendations for restrictions regarding the COVID-19 Pandemic (e.g. social distancing). This format is felt to be most appropriate for this patient at this time. All issues noted in this document were discussed and addressed. No physical exam was performed (except for noted visual exam findings with Video Visits). This patient, Ms. Amanda Rocha, has given permission to perform this visit via telephone. Vital signs may be absent or patient reported.  Patient location:  At home  Nurse location:  [facility] office

## 2019-04-22 ENCOUNTER — Telehealth: Payer: Self-pay

## 2019-04-22 NOTE — Telephone Encounter (Signed)
Patient called stating she is having knee and hip pain and she would like xrays.  I RETURNED PT CALL AND SCHEDULED HER FOR A TELEPHONE VISIT. Amanda Rocha

## 2019-04-23 ENCOUNTER — Other Ambulatory Visit: Payer: Self-pay

## 2019-04-23 ENCOUNTER — Telehealth: Payer: Self-pay

## 2019-04-23 ENCOUNTER — Encounter: Payer: Medicare Other | Admitting: Nurse Practitioner

## 2019-04-23 NOTE — Telephone Encounter (Signed)
I called pt to get her ready for her telephone visit and pt stated she couldn't do anything until her daughter is there with her. I offered pt a new appointment and she stated she has to check with her daughter I provided pt with my name and our number with my ext so she can call me back and we can schedule her appt. YRL,RMA

## 2019-04-24 ENCOUNTER — Encounter: Payer: Self-pay | Admitting: Nurse Practitioner

## 2019-04-30 ENCOUNTER — Ambulatory Visit (INDEPENDENT_AMBULATORY_CARE_PROVIDER_SITE_OTHER): Payer: Medicare Other | Admitting: Nurse Practitioner

## 2019-04-30 ENCOUNTER — Encounter: Payer: Self-pay | Admitting: Nurse Practitioner

## 2019-04-30 ENCOUNTER — Other Ambulatory Visit: Payer: Self-pay

## 2019-04-30 VITALS — BP 114/60 | HR 72 | Temp 98.7°F | Ht 60.8 in | Wt 126.6 lb

## 2019-04-30 DIAGNOSIS — I4891 Unspecified atrial fibrillation: Secondary | ICD-10-CM

## 2019-04-30 DIAGNOSIS — E7800 Pure hypercholesterolemia, unspecified: Secondary | ICD-10-CM

## 2019-04-30 DIAGNOSIS — M25551 Pain in right hip: Secondary | ICD-10-CM

## 2019-04-30 DIAGNOSIS — I10 Essential (primary) hypertension: Secondary | ICD-10-CM

## 2019-04-30 DIAGNOSIS — K5900 Constipation, unspecified: Secondary | ICD-10-CM | POA: Diagnosis not present

## 2019-04-30 MED ORDER — KETOROLAC TROMETHAMINE 60 MG/2ML IM SOLN
60.0000 mg | Freq: Once | INTRAMUSCULAR | Status: AC
  Administered 2019-04-30: 60 mg via INTRAMUSCULAR

## 2019-04-30 MED ORDER — ACETAMINOPHEN 500 MG PO TABS: 500.0000 mg | ORAL_TABLET | Freq: Every day | ORAL | 1 refills | Status: DC

## 2019-04-30 MED ORDER — PANTOPRAZOLE SODIUM 40 MG PO TBEC: 40.0000 mg | DELAYED_RELEASE_TABLET | Freq: Every day | ORAL | 2 refills | Status: AC

## 2019-04-30 NOTE — Progress Notes (Signed)
Subjective:     Patient ID: Berline Semrad , female    DOB: 1921-07-17 , 85 y.o.   MRN: [id number]   Chief Complaint  Patient presents with  . Hip Pain    patient stated her right hip has been hurting her since march. patient states she has a constant ache and it radiates down to her knee.     HPI  Wt Readings from Last 3 Encounters: 04/30/19 : 126 lb 9.6 oz (57.4 kg) 03/19/19 : 125 lb (56.7 kg) 11/27/18 : 128 lb 3.2 oz (58.2 kg)   Hip Pain  The incident occurred more than 1 week ago. There was no injury mechanism. The pain is present in the left hip, left knee and right knee. The quality of the pain is described as aching. The pain is moderate. The pain has been constant since onset. Pertinent negatives include no inability to bear weight, numbness or tingling. The symptoms are aggravated by movement. She has tried acetaminophen, heat and rest (she has also used a pain cream) for the symptoms. The treatment provided mild relief.     Past Medical History:  Diagnosis Date  . Arthritis    "knees" (04/25/2017)  . Colitis   . Hypertension   . New onset atrial fibrillation ([place]) 04/24/2017   Archie Endo 04/24/2017  . Syncope   . Uterine cancer ([place]) 1960s   "healed of it; no treatment"      Family History  Problem Relation Age of Onset  . Diabetes Mother   . Hypertension Father      Current Outpatient Medications:  .  aspirin EC 81 MG tablet, Take 81 mg by mouth daily., Disp: , Rfl:  .  lisinopril-hydrochlorothiazide (ZESTORETIC) 20-12.5 MG tablet, TAKE 1 TABLET EVERY DAY, Disp: 90 tablet, Rfl: 0 .  SENNA PO, Take 1 Package by mouth as needed. Senna leaves tea, Disp: , Rfl:  .  ENSURE (ENSURE), Take 237 mLs by mouth daily., Disp: , Rfl:  .  pantoprazole (PROTONIX) 40 MG tablet, Take 1 tablet (40 mg total) by mouth daily., Disp: 30 tablet, Rfl: 2   No Known Allergies   Review of Systems  Constitutional: Negative for fatigue.  Respiratory: Negative for cough.    Musculoskeletal: Positive for arthralgias (right hip) and gait problem (SLOW GAIT). Negative for back pain, joint swelling and myalgias.       Using the cane  Neurological: Negative for dizziness, tingling and numbness.     Today's Vitals   04/30/19 1041  BP: 114/60  Pulse: 72  Temp: 98.7 F (37.1 C)  TempSrc: Oral  Weight: 126 lb 9.6 oz (57.4 kg)  Height: 5' 0.8" (1.544 m)  PainSc: 10-Worst pain ever  PainLoc: Hip   Body mass index is 24.08 kg/m.   Objective:  Physical Exam Vitals signs reviewed.  Constitutional:      Appearance: Normal appearance.  Cardiovascular:     Rate and Rhythm: Normal rate and regular rhythm.     Pulses: Normal pulses.     Heart sounds: Normal heart sounds. No murmur.  Skin:    General: Skin is warm and dry.  Neurological:     General: No focal deficit present.     Mental Status: She is alert.  Psychiatric:        Mood and Affect: Mood normal.        Behavior: Behavior normal.        Thought Content: Thought content normal.        Judgment:  Judgment normal.         Assessment And Plan:     1. Right hip pain  Persistent hip pain  Will refer to orthopedics for further evaluation  toradol 60 mg given IM  - Ambulatory referral to Orthopedic Surgery - acetaminophen (TYLENOL) 500 MG tablet; Take 1 tablet (500 mg total) by mouth daily.  Dispense: 90 tablet; Refill: 1 - ketorolac (TORADOL) injection 60 mg  2. Constipation, unspecified constipation type  She is not taking the linzess, I have encouraged her to increase her water intake and to take a stool softner to see if this is helpful  3. Essential hypertension . B/P is controlled.  . CMP ordered to check renal function.  . The importance of regular exercise and dietary modification was stressed to the patient.   4. Atrial fibrillation, unspecified type (HCC)  Chronic, controlled  Continue with current medications  No current issues  5. Elevated cholesterol  Chronic,  controlled  Continue with current medications     Minette Brine, FNP

## 2019-05-01 LAB — LIPID PANEL
Chol/HDL Ratio: 2.8 ratio (ref 0.0–4.4)
Cholesterol, Total: 207 mg/dL — ABNORMAL HIGH (ref 100–199)
HDL: 74 mg/dL (ref >39)
LDL Calculated: 118 mg/dL — ABNORMAL HIGH (ref 0–99)
Triglycerides: 76 mg/dL (ref 0–149)
VLDL Cholesterol Cal: 15 mg/dL (ref 5–40)

## 2019-05-01 LAB — BMP8+EGFR
BUN/Creatinine Ratio: 21 (ref 12–28)
BUN: 17 mg/dL (ref 10–36)
CO2: 28 mmol/L (ref 20–29)
Calcium: 9.8 mg/dL (ref 8.7–10.3)
Chloride: 97 mmol/L (ref 96–106)
Creatinine, Ser: 0.8 mg/dL (ref 0.57–1.00)
GFR calc Af Amer: 72 mL/min/{1.73_m2} (ref >59)
GFR calc non Af Amer: 63 mL/min/{1.73_m2} (ref >59)
Glucose: 89 mg/dL (ref 65–99)
Potassium: 4.5 mmol/L (ref 3.5–5.2)
Sodium: 139 mmol/L (ref 134–144)

## 2019-05-27 ENCOUNTER — Telehealth: Payer: Self-pay

## 2019-05-27 NOTE — Telephone Encounter (Signed)
Patient's daughter called asking that we cancel patient's appointment because she is going to an orthopaedic and they are taking care of the hip pain. She stated the patient had some xrays done and she doesn't have any broken bones. YRL,RMA

## 2019-05-29 ENCOUNTER — Ambulatory Visit: Payer: Medicare Other | Admitting: Internal Medicine

## 2019-05-29 ENCOUNTER — Ambulatory Visit: Payer: Medicare Other | Admitting: Nurse Practitioner

## 2019-06-09 ENCOUNTER — Telehealth: Payer: Self-pay

## 2019-06-09 ENCOUNTER — Other Ambulatory Visit: Payer: Self-pay | Admitting: Nurse Practitioner

## 2019-06-09 NOTE — Telephone Encounter (Signed)
Patient notified her refill has been sent to the pharmacy. YRL,RMA

## 2019-08-12 ENCOUNTER — Other Ambulatory Visit: Payer: Self-pay | Admitting: Nurse Practitioner

## 2019-08-19 ENCOUNTER — Other Ambulatory Visit: Payer: Self-pay | Admitting: Nurse Practitioner

## 2019-11-03 ENCOUNTER — Other Ambulatory Visit: Payer: Self-pay

## 2019-11-03 NOTE — Patient Outreach (Signed)
[facility]) Care Management  11/03/2019  Amanda Rocha 1920-10-29 [id number]   Medication Adherence call to [name] Telephone call to Patient regarding Medication Adherence unable to reach patient,patient did not answer,patient is showing past due on Lisinopril/Hctz 20/12.5 mg patient has already pick up a 30 days supply from the pharmacy. [name] is showing due under [name].   [facility] Direct Dial [phone]  Fax [phone] [email or web address]

## 2019-11-28 ENCOUNTER — Other Ambulatory Visit: Payer: Self-pay | Admitting: Nurse Practitioner

## 2019-12-01 ENCOUNTER — Other Ambulatory Visit: Payer: Self-pay

## 2019-12-01 MED ORDER — LISINOPRIL-HYDROCHLOROTHIAZIDE 20-12.5 MG PO TABS: ORAL_TABLET | ORAL | 0 refills | Status: AC

## 2020-03-02 ENCOUNTER — Ambulatory Visit (INDEPENDENT_AMBULATORY_CARE_PROVIDER_SITE_OTHER): Payer: Medicare Other | Admitting: Nurse Practitioner

## 2020-03-02 ENCOUNTER — Other Ambulatory Visit: Payer: Self-pay

## 2020-03-02 ENCOUNTER — Encounter: Payer: Self-pay | Admitting: Nurse Practitioner

## 2020-03-02 VITALS — BP 150/70 | HR 51 | Temp 100.1°F | Ht 60.0 in | Wt 118.0 lb

## 2020-03-02 DIAGNOSIS — R609 Edema, unspecified: Secondary | ICD-10-CM | POA: Diagnosis not present

## 2020-03-02 DIAGNOSIS — R0600 Dyspnea, unspecified: Secondary | ICD-10-CM | POA: Diagnosis not present

## 2020-03-02 DIAGNOSIS — N390 Urinary tract infection, site not specified: Secondary | ICD-10-CM | POA: Diagnosis not present

## 2020-03-02 DIAGNOSIS — R300 Dysuria: Secondary | ICD-10-CM | POA: Diagnosis not present

## 2020-03-02 LAB — POCT URINALYSIS DIPSTICK
Bilirubin, UA: NEGATIVE
Glucose, UA: NEGATIVE
Ketones, UA: NEGATIVE
Nitrite, UA: POSITIVE
Protein, UA: NEGATIVE
Spec Grav, UA: >=1.03 — AB (ref 1.010–1.025)
Urobilinogen, UA: 0.2 E.U./dL
pH, UA: 8.5 — AB (ref 5.0–8.0)

## 2020-03-02 MED ORDER — NITROFURANTOIN MONOHYD MACRO 100 MG PO CAPS: 100.0000 mg | ORAL_CAPSULE | Freq: Two times a day (BID) | ORAL | 0 refills | Status: AC

## 2020-03-02 MED ORDER — CEFTRIAXONE SODIUM 1 G IJ SOLR
1.0000 g | Freq: Once | INTRAMUSCULAR | Status: AC
  Administered 2020-03-02: 1 g via INTRAMUSCULAR

## 2020-03-02 NOTE — Progress Notes (Signed)
This visit occurred during the SARS-CoV-2 public health emergency.  Safety protocols were in place, including screening questions prior to the visit, additional usage of staff PPE, and extensive cleaning of exam room while observing appropriate contact time as indicated for disinfecting solutions.  Subjective:     Patient ID: Amanda Rocha , female    DOB: 04-May-1921 , 86 y.o.   MRN: [id number]   Chief Complaint  Patient presents with   Urinary Tract Infection   Edema    HPI  Urinary Tract Infection  This is a new problem. The current episode started 1 to 4 weeks ago. The problem occurs intermittently. The problem has been gradually worsening. The quality of the pain is described as aching. The patient is experiencing no pain. There has been no fever. She is not sexually active. There is no history of pyelonephritis. Associated symptoms include frequency. Pertinent negatives include no chills, flank pain, hesitancy or nausea. There is no history of recurrent UTIs.     Past Medical History:  Diagnosis Date   Arthritis    "knees" (04/25/2017)   Colitis    Hypertension    New onset atrial fibrillation ([place]) 04/24/2017   Archie Endo 04/24/2017   Syncope    Uterine cancer ([place]) 1960s   "healed of it; no treatment"      Family History  Problem Relation Age of Onset   Diabetes Mother    Hypertension Father      Current Outpatient Medications:    acetaminophen (TYLENOL) 500 MG tablet, Take 1 tablet (500 mg total) by mouth daily., Disp: 90 tablet, Rfl: 1   aspirin EC 81 MG tablet, Take 81 mg by mouth daily., Disp: , Rfl:    ENSURE (ENSURE), Take 237 mLs by mouth daily., Disp: , Rfl:    lisinopril-hydrochlorothiazide (ZESTORETIC) 20-12.5 MG tablet, Take one tablet by mouth once daily if blood pressure is >140/90 in evening take 2nd tablet, Disp: 180 tablet, Rfl: 0   pantoprazole (PROTONIX) 40 MG tablet, Take 1 tablet (40 mg total) by mouth daily., Disp: 30 tablet, Rfl: 2    SENNA PO, Take 1 Package by mouth as needed. Senna leaves tea, Disp: , Rfl:    No Known Allergies   Review of Systems  Constitutional: Negative for chills.  Gastrointestinal: Negative for nausea.  Genitourinary: Positive for frequency. Negative for flank pain and hesitancy.     Today's Vitals   03/02/20 1644  BP: (!) 150/70  Pulse: (!) 51  Temp: 100.1 F (37.8 C)  TempSrc: Oral  Weight: 118 lb (53.5 kg)  Height: 5' (1.524 m)  PainSc: 0-No pain   Body mass index is 23.05 kg/m.   Objective:  Physical Exam Constitutional:      Appearance: Normal appearance.  Cardiovascular:     Rate and Rhythm: Normal rate and regular rhythm.     Pulses: Normal pulses.     Heart sounds: Normal heart sounds. No murmur.  Neurological:     General: No focal deficit present.     Mental Status: She is alert and oriented to person, place, and time.     Cranial Nerves: No cranial nerve deficit.  Psychiatric:        Mood and Affect: Mood normal.        Behavior: Behavior normal.        Thought Content: Thought content normal.        Judgment: Judgment normal.         Assessment And Plan:  1. Urinary tract infection without hematuria, site unspecified  Positive for urinary tract infection  Will treat with rocephin and send for culture  Encouraged to increase her water intake. - Culture, Urine - cefTRIAXone (ROCEPHIN) injection 1 g - nitrofurantoin, macrocrystal-monohydrate, (MACROBID) 100 MG capsule; Take 1 capsule (100 mg total) by mouth 2 (two) times daily for 5 days.  Dispense: 10 capsule; Refill: 0 - Myoglobin, urine - CBC with Diff - CMP14+EGFR  2. Dysuria  Increase water intake - POCT Urinalysis Dipstick (81002)  3. Edema, unspecified type  Lower extremity edema, encouraged to elevate feet when possible and limit salt intake.        Minette Brine, FNP    THE PATIENT IS ENCOURAGED TO PRACTICE SOCIAL DISTANCING DUE TO THE COVID-19 PANDEMIC.

## 2020-03-03 LAB — CMP14+EGFR
ALT: 6 IU/L (ref 0–32)
AST: 14 IU/L (ref 0–40)
Albumin/Globulin Ratio: 0.9 — ABNORMAL LOW (ref 1.2–2.2)
Albumin: 3.4 g/dL — ABNORMAL LOW (ref 3.5–4.6)
Alkaline Phosphatase: 85 IU/L (ref 48–121)
BUN/Creatinine Ratio: 16 (ref 12–28)
BUN: 12 mg/dL (ref 10–36)
Bilirubin Total: 0.5 mg/dL (ref 0.0–1.2)
CO2: 26 mmol/L (ref 20–29)
Calcium: 9 mg/dL (ref 8.7–10.3)
Chloride: 96 mmol/L (ref 96–106)
Creatinine, Ser: 0.74 mg/dL (ref 0.57–1.00)
GFR calc Af Amer: 79 mL/min/{1.73_m2} (ref >59)
GFR calc non Af Amer: 69 mL/min/{1.73_m2} (ref >59)
Globulin, Total: 3.6 g/dL (ref 1.5–4.5)
Glucose: 105 mg/dL — ABNORMAL HIGH (ref 65–99)
Potassium: 4.4 mmol/L (ref 3.5–5.2)
Sodium: 135 mmol/L (ref 134–144)
Total Protein: 7 g/dL (ref 6.0–8.5)

## 2020-03-03 LAB — CBC WITH DIFFERENTIAL/PLATELET
Basophils Absolute: 0.1 10*3/uL (ref 0.0–0.2)
Basos: 1 %
EOS (ABSOLUTE): 0 10*3/uL (ref 0.0–0.4)
Eos: 0 %
Hematocrit: 33.8 % — ABNORMAL LOW (ref 34.0–46.6)
Hemoglobin: 10.8 g/dL — ABNORMAL LOW (ref 11.1–15.9)
Immature Grans (Abs): 0 10*3/uL (ref 0.0–0.1)
Immature Granulocytes: 0 %
Lymphocytes Absolute: 1.2 10*3/uL (ref 0.7–3.1)
Lymphs: 12 %
MCH: 25.2 pg — ABNORMAL LOW (ref 26.6–33.0)
MCHC: 32 g/dL (ref 31.5–35.7)
MCV: 79 fL (ref 79–97)
Monocytes Absolute: 0.6 10*3/uL (ref 0.1–0.9)
Monocytes: 6 %
Neutrophils Absolute: 7.8 10*3/uL — ABNORMAL HIGH (ref 1.4–7.0)
Neutrophils: 81 %
Platelets: 355 10*3/uL (ref 150–450)
RBC: 4.29 x10E6/uL (ref 3.77–5.28)
RDW: 14.2 % (ref 11.7–15.4)
WBC: 9.6 10*3/uL (ref 3.4–10.8)

## 2020-03-03 LAB — BRAIN NATRIURETIC PEPTIDE: BNP: 122.8 pg/mL — ABNORMAL HIGH (ref 0.0–100.0)

## 2020-03-03 LAB — MYOGLOBIN, URINE: Myoglobin, Ur: 3 ng/mL (ref 0–13)

## 2020-03-05 LAB — URINE CULTURE

## 2020-03-10 ENCOUNTER — Other Ambulatory Visit: Payer: Self-pay

## 2020-03-10 DIAGNOSIS — N390 Urinary tract infection, site not specified: Secondary | ICD-10-CM | POA: Diagnosis not present

## 2020-03-11 LAB — URINE CULTURE

## 2020-03-24 ENCOUNTER — Ambulatory Visit: Payer: Medicare Other

## 2020-03-24 ENCOUNTER — Encounter: Payer: Medicare Other | Admitting: Nurse Practitioner

## 2020-03-31 ENCOUNTER — Other Ambulatory Visit: Payer: Self-pay

## 2020-03-31 ENCOUNTER — Ambulatory Visit (INDEPENDENT_AMBULATORY_CARE_PROVIDER_SITE_OTHER): Payer: Medicare Other | Admitting: Nurse Practitioner

## 2020-03-31 ENCOUNTER — Encounter: Payer: Self-pay | Admitting: Nurse Practitioner

## 2020-03-31 VITALS — BP 138/76 | HR 93 | Temp 98.7°F | Ht 61.0 in | Wt 122.8 lb

## 2020-03-31 DIAGNOSIS — R32 Unspecified urinary incontinence: Secondary | ICD-10-CM

## 2020-03-31 DIAGNOSIS — R1030 Lower abdominal pain, unspecified: Secondary | ICD-10-CM | POA: Diagnosis not present

## 2020-03-31 DIAGNOSIS — K5900 Constipation, unspecified: Secondary | ICD-10-CM

## 2020-03-31 DIAGNOSIS — N390 Urinary tract infection, site not specified: Secondary | ICD-10-CM

## 2020-03-31 DIAGNOSIS — R531 Weakness: Secondary | ICD-10-CM

## 2020-03-31 DIAGNOSIS — R300 Dysuria: Secondary | ICD-10-CM | POA: Diagnosis not present

## 2020-03-31 MED ORDER — ACETAMINOPHEN 500 MG PO TABS
500.0000 mg | ORAL_TABLET | Freq: Two times a day (BID) | ORAL | 1 refills | Status: AC
Start: 2020-03-31 — End: ?

## 2020-03-31 MED ORDER — LUBIPROSTONE 8 MCG PO CAPS: 8.0000 ug | ORAL_CAPSULE | Freq: Every day | ORAL | 1 refills | Status: DC

## 2020-03-31 NOTE — Progress Notes (Signed)
This visit occurred during the SARS-CoV-2 public health emergency.  Safety protocols were in place, including screening questions prior to the visit, additional usage of staff PPE, and extensive cleaning of exam room while observing appropriate contact time as indicated for disinfecting solutions.  Subjective:     Patient ID: Amanda Rocha , female    DOB: 25-Jul-1921 , 86 y.o.   MRN: [id number]   Chief Complaint  Patient presents with  . Leg Swelling    both feet have been swollen making it hard for her to walk   . Dysuria    patient stated everytime she uses the restroom she has some burning sensation still  pt stated she still has lower abdominal pain from when she had the uti     HPI  She is here due to swelling to her legs, denies pain to the lower leg but has knee pain. She does admit to not drinking enough water. She is wearing a depends.  She will often have a wet brief.  She does have a bedside commode but has just started using. She is having pain which limits her from walking.   Dysuria  This is a recurrent problem. The problem occurs every urination. The quality of the pain is described as burning. The patient is experiencing no pain. There has been no fever. She is not sexually active. There is no history of pyelonephritis. Associated symptoms include urgency. Pertinent negatives include no chills or hesitancy.     Past Medical History:  Diagnosis Date  . Arthritis    "knees" (04/25/2017)  . Colitis   . Hypertension   . New onset atrial fibrillation ([place]) 04/24/2017   Archie Endo 04/24/2017  . Syncope   . Uterine cancer ([place]) 1960s   "healed of it; no treatment"      Family History  Problem Relation Age of Onset  . Diabetes Mother   . Hypertension Father      Current Outpatient Medications:  .  acetaminophen (TYLENOL) 500 MG tablet, Take 1 tablet (500 mg total) by mouth daily., Disp: 90 tablet, Rfl: 1 .  aspirin EC 81 MG tablet, Take 81 mg by mouth daily., Disp: ,  Rfl:  .  ENSURE (ENSURE), Take 237 mLs by mouth daily., Disp: , Rfl:  .  lisinopril-hydrochlorothiazide (ZESTORETIC) 20-12.5 MG tablet, Take one tablet by mouth once daily if blood pressure is >140/90 in evening take 2nd tablet, Disp: 180 tablet, Rfl: 0 .  pantoprazole (PROTONIX) 40 MG tablet, Take 1 tablet (40 mg total) by mouth daily., Disp: 30 tablet, Rfl: 2 .  SENNA PO, Take 1 Package by mouth as needed. Senna leaves tea, Disp: , Rfl:    No Known Allergies   Review of Systems  Constitutional: Negative for chills and fatigue.  Genitourinary: Positive for dysuria and urgency. Negative for hesitancy.  Neurological: Negative for dizziness and headaches.     Today's Vitals   03/31/20 1539  BP: 138/76  Pulse: 93  Temp: 98.7 F (37.1 C)  TempSrc: Oral  Weight: 122 lb 12.8 oz (55.7 kg)  Height: 5\' 1"  (1.549 m)  PainSc: 0-No pain   Body mass index is 23.2 kg/m.   Objective:  Physical Exam Vitals reviewed.  Constitutional:      General: She is not in acute distress.    Appearance: Normal appearance.  Cardiovascular:     Rate and Rhythm: Normal rate and regular rhythm.     Pulses: Normal pulses.     Heart sounds: Normal  heart sounds. No murmur heard.   Pulmonary:     Effort: Pulmonary effort is normal. No respiratory distress.     Breath sounds: Normal breath sounds.  Abdominal:     General: Abdomen is flat. Bowel sounds are normal. There is no distension.     Palpations: Abdomen is soft.     Tenderness: There is no abdominal tenderness.  Neurological:     General: No focal deficit present.     Mental Status: She is alert and oriented to person, place, and time.     Cranial Nerves: No cranial nerve deficit.  Psychiatric:        Mood and Affect: Mood normal.        Behavior: Behavior normal.        Thought Content: Thought content normal.        Judgment: Judgment normal.         Assessment And Plan:   1. Lower abdominal pain  Urine was scarce and pale  color  Will send for another culture.  - POCT Urinalysis Dipstick (81002)  2. Constipation, unspecified constipation type  Will try amitiza to see if this helps her.    Encouraged to stay well hydrated with water - lubiprostone (AMITIZA) 8 MCG capsule; Take 1 capsule (8 mcg total) by mouth daily with breakfast.  Dispense: 90 capsule; Refill: 1  3. Dysuria   4. Generalized weakness  Daughter concerned about her not moving around as much   Will have PT evaluation for endurance and strength - Ambulatory referral to [facility]  5. Urinary incontinence, unspecified type  May be related to urinary tract infection - Ambulatory referral to [place], FNP    THE PATIENT IS ENCOURAGED TO PRACTICE SOCIAL DISTANCING DUE TO THE COVID-19 PANDEMIC.

## 2020-04-03 LAB — URINE CULTURE

## 2020-04-05 MED ORDER — LEVOFLOXACIN 750 MG PO TABS: 750.0000 mg | ORAL_TABLET | Freq: Every day | ORAL | 0 refills | Status: AC

## 2020-04-06 NOTE — Progress Notes (Signed)
This visit occurred during the SARS-CoV-2 public health emergency.  Safety protocols were in place, including screening questions prior to the visit, additional usage of staff PPE, and extensive cleaning of exam room while observing appropriate contact time as indicated for disinfecting solutions.  Subjective:     Patient ID: Amanda Rocha , female    DOB: 23-Apr-1921 , 86 y.o.   MRN: [id number]   Chief Complaint  Patient presents with  . Urinary Tract Infection    HPI  Here today for her regular office visit for physical but she is having chills, fatigue and not feeling well. Urine is thick white color.  I will reschedule her physical and AWV    Past Medical History:  Diagnosis Date  . Arthritis    "knees" (04/25/2017)  . Colitis   . Hypertension   . New onset atrial fibrillation ([place]) 04/24/2017   Archie Endo 04/24/2017  . Syncope   . Uterine cancer ([place]) 1960s   "healed of it; no treatment"      Family History  Problem Relation Age of Onset  . Diabetes Mother   . Hypertension Father      Current Outpatient Medications:  .  acetaminophen (TYLENOL) 500 MG tablet, Take 1 tablet (500 mg total) by mouth in the morning and at bedtime., Disp: 90 tablet, Rfl: 1 .  aspirin EC 81 MG tablet, Take 81 mg by mouth daily., Disp: , Rfl:  .  ENSURE (ENSURE), Take 237 mLs by mouth daily., Disp: , Rfl:  .  levofloxacin (LEVAQUIN) 750 MG tablet, Take 1 tablet (750 mg total) by mouth daily for 7 days., Disp: 7 tablet, Rfl: 0 .  lisinopril-hydrochlorothiazide (ZESTORETIC) 20-12.5 MG tablet, Take one tablet by mouth once daily if blood pressure is >140/90 in evening take 2nd tablet, Disp: 180 tablet, Rfl: 0 .  lubiprostone (AMITIZA) 8 MCG capsule, Take 1 capsule (8 mcg total) by mouth daily with breakfast., Disp: 90 capsule, Rfl: 1 .  pantoprazole (PROTONIX) 40 MG tablet, Take 1 tablet (40 mg total) by mouth daily., Disp: 30 tablet, Rfl: 2 .  SENNA PO, Take 1 Package by mouth as needed. Senna  leaves tea, Disp: , Rfl:    No Known Allergies   Review of Systems  Constitutional: Positive for fatigue.  Respiratory: Negative.   Cardiovascular: Negative.  Negative for chest pain, palpitations and leg swelling.  Gastrointestinal: Positive for abdominal pain. Negative for abdominal distention, constipation, diarrhea, nausea, rectal pain and vomiting.  Neurological: Negative for dizziness and headaches.     Today's Vitals   04/07/20 1442  BP: 118/80  Pulse: 95  Temp: 98.6 F (37 C)  TempSrc: Oral  SpO2: 98%  Weight: 126 lb 9.6 oz (57.4 kg)  Height: 5' 0.6" (1.539 m)  PainSc: 0-No pain   Body mass index is 24.24 kg/m.   Objective:  Physical Exam Vitals reviewed.  Constitutional:      General: She is not in acute distress.    Appearance: Normal appearance.  Cardiovascular:     Rate and Rhythm: Normal rate and regular rhythm.     Pulses: Normal pulses.     Heart sounds: No murmur heard.   Pulmonary:     Effort: Pulmonary effort is normal. No respiratory distress.     Breath sounds: Normal breath sounds.  Abdominal:     General: Abdomen is flat. There is no distension.     Palpations: Abdomen is soft. There is no mass.     Tenderness: There is  abdominal tenderness (right lower quadrant). There is no right CVA tenderness, left CVA tenderness or guarding.  Skin:    General: Skin is warm and dry.  Neurological:     General: No focal deficit present.     Mental Status: She is alert and oriented to person, place, and time.     Cranial Nerves: No cranial nerve deficit.  Psychiatric:        Mood and Affect: Mood normal.        Behavior: Behavior normal.        Thought Content: Thought content normal.        Judgment: Judgment normal.         Assessment And Plan:     Problem List Items Addressed This Visit      Cardiovascular and Mediastinum   Essential hypertension - Primary   Relevant Orders   POCT UA - Microalbumin   EKG 12-Lead    Other Visit Diagnoses     Urinary tract infection without hematuria, site unspecified       urine continues to be thick, white and purulent with strong odor she is fatigued and having some chills will refer to [facility] for evaluation        Report called to [facility] ER may need CT scan or imaging study to check for abcess or fistula   Patient was given opportunity to ask questions. Patient verbalized understanding of the plan and was able to repeat key elements of the plan. All questions were answered to their satisfaction.  Minette Brine, FNP   I, Minette Brine, FNP, have reviewed all documentation for this visit. The documentation on 04/07/20 for the exam, diagnosis, procedures, and orders are all accurate and complete.  THE PATIENT IS ENCOURAGED TO PRACTICE SOCIAL DISTANCING DUE TO THE COVID-19 PANDEMIC.

## 2020-04-07 ENCOUNTER — Encounter: Payer: Self-pay | Admitting: Nurse Practitioner

## 2020-04-07 ENCOUNTER — Encounter (HOSPITAL_COMMUNITY): Payer: Self-pay

## 2020-04-07 ENCOUNTER — Emergency Department (HOSPITAL_COMMUNITY): Payer: Medicare Other

## 2020-04-07 ENCOUNTER — Ambulatory Visit (INDEPENDENT_AMBULATORY_CARE_PROVIDER_SITE_OTHER): Payer: Medicare Other | Admitting: Nurse Practitioner

## 2020-04-07 ENCOUNTER — Emergency Department (HOSPITAL_COMMUNITY)
Admission: EM | Admit: 2020-04-07 | Discharge: 2020-04-07 | Disposition: A | Discharge status: Home / Self Care | Payer: Medicare Other | Attending: Emergency Medicine | Admitting: Emergency Medicine

## 2020-04-07 ENCOUNTER — Other Ambulatory Visit: Payer: Self-pay

## 2020-04-07 VITALS — BP 118/80 | HR 95 | Temp 98.6°F | Ht 60.6 in | Wt 126.6 lb

## 2020-04-07 DIAGNOSIS — I700 Atherosclerosis of aorta: Secondary | ICD-10-CM | POA: Diagnosis not present

## 2020-04-07 DIAGNOSIS — I10 Essential (primary) hypertension: Secondary | ICD-10-CM | POA: Insufficient documentation

## 2020-04-07 DIAGNOSIS — N390 Urinary tract infection, site not specified: Secondary | ICD-10-CM | POA: Insufficient documentation

## 2020-04-07 DIAGNOSIS — Z96659 Presence of unspecified artificial knee joint: Secondary | ICD-10-CM | POA: Diagnosis not present

## 2020-04-07 DIAGNOSIS — Z8542 Personal history of malignant neoplasm of other parts of uterus: Secondary | ICD-10-CM | POA: Diagnosis not present

## 2020-04-07 DIAGNOSIS — Z79899 Other long term (current) drug therapy: Secondary | ICD-10-CM | POA: Diagnosis not present

## 2020-04-07 DIAGNOSIS — Z7982 Long term (current) use of aspirin: Secondary | ICD-10-CM | POA: Insufficient documentation

## 2020-04-07 DIAGNOSIS — K6389 Other specified diseases of intestine: Secondary | ICD-10-CM | POA: Diagnosis not present

## 2020-04-07 DIAGNOSIS — K5732 Diverticulitis of large intestine without perforation or abscess without bleeding: Secondary | ICD-10-CM | POA: Diagnosis not present

## 2020-04-07 DIAGNOSIS — K5792 Diverticulitis of intestine, part unspecified, without perforation or abscess without bleeding: Secondary | ICD-10-CM

## 2020-04-07 DIAGNOSIS — K7689 Other specified diseases of liver: Secondary | ICD-10-CM | POA: Diagnosis not present

## 2020-04-07 DIAGNOSIS — K5939 Other megacolon: Secondary | ICD-10-CM | POA: Diagnosis not present

## 2020-04-07 DIAGNOSIS — R300 Dysuria: Secondary | ICD-10-CM | POA: Diagnosis present

## 2020-04-07 LAB — CBC WITH DIFFERENTIAL/PLATELET
Abs Immature Granulocytes: 0.06 10*3/uL (ref 0.00–0.07)
Basophils Absolute: 0.1 10*3/uL (ref 0.0–0.1)
Basophils Relative: 1 %
Eosinophils Absolute: 0.1 10*3/uL (ref 0.0–0.5)
Eosinophils Relative: 1 %
HCT: 31.3 % — ABNORMAL LOW (ref 36.0–46.0)
Hemoglobin: 9.6 g/dL — ABNORMAL LOW (ref 12.0–15.0)
Immature Granulocytes: 1 %
Lymphocytes Relative: 28 %
Lymphs Abs: 3 10*3/uL (ref 0.7–4.0)
MCH: 24.7 pg — ABNORMAL LOW (ref 26.0–34.0)
MCHC: 30.7 g/dL (ref 30.0–36.0)
MCV: 80.7 fL (ref 80.0–100.0)
Monocytes Absolute: 0.9 10*3/uL (ref 0.1–1.0)
Monocytes Relative: 9 %
Neutro Abs: 6.6 10*3/uL (ref 1.7–7.7)
Neutrophils Relative %: 60 %
Platelets: 429 10*3/uL — ABNORMAL HIGH (ref 150–400)
RBC: 3.88 MIL/uL (ref 3.87–5.11)
RDW: 16.9 % — ABNORMAL HIGH (ref 11.5–15.5)
WBC: 10.8 10*3/uL — ABNORMAL HIGH (ref 4.0–10.5)
nRBC: 0 % (ref 0.0–0.2)

## 2020-04-07 LAB — COMPREHENSIVE METABOLIC PANEL
ALT: 14 U/L (ref 0–44)
AST: 15 U/L (ref 15–41)
Albumin: 2.5 g/dL — ABNORMAL LOW (ref 3.5–5.0)
Alkaline Phosphatase: 75 U/L (ref 38–126)
Anion gap: 8 (ref 5–15)
BUN: 9 mg/dL (ref 8–23)
CO2: 31 mmol/L (ref 22–32)
Calcium: 8.8 mg/dL — ABNORMAL LOW (ref 8.9–10.3)
Chloride: 93 mmol/L — ABNORMAL LOW (ref 98–111)
Creatinine, Ser: 0.59 mg/dL (ref 0.44–1.00)
GFR calc Af Amer: >60 mL/min (ref >60)
GFR calc non Af Amer: >60 mL/min (ref >60)
Glucose, Bld: 101 mg/dL — ABNORMAL HIGH (ref 70–99)
Potassium: 3.7 mmol/L (ref 3.5–5.1)
Sodium: 132 mmol/L — ABNORMAL LOW (ref 135–145)
Total Bilirubin: 0.6 mg/dL (ref 0.3–1.2)
Total Protein: 7 g/dL (ref 6.5–8.1)

## 2020-04-07 LAB — URINALYSIS, ROUTINE W REFLEX MICROSCOPIC
Bilirubin Urine: NEGATIVE
Glucose, UA: NEGATIVE mg/dL
Ketones, ur: 5 mg/dL — AB
Nitrite: NEGATIVE
Protein, ur: >=300 mg/dL — AB
Specific Gravity, Urine: 1.015 (ref 1.005–1.030)
pH: 6 (ref 5.0–8.0)

## 2020-04-07 LAB — URINALYSIS, MICROSCOPIC (REFLEX): WBC, UA: >50 WBC/hpf (ref 0–5)

## 2020-04-07 MED ORDER — SULFAMETHOXAZOLE-TRIMETHOPRIM 800-160 MG PO TABS: 1.0000 | ORAL_TABLET | Freq: Two times a day (BID) | ORAL | 0 refills | Status: AC

## 2020-04-07 MED ORDER — METRONIDAZOLE 500 MG PO TABS: 500.0000 mg | ORAL_TABLET | Freq: Three times a day (TID) | ORAL | 0 refills | Status: AC

## 2020-04-07 MED ORDER — IOHEXOL 300 MG/ML  SOLN
100.0000 mL | Freq: Once | INTRAMUSCULAR | Status: AC | PRN
  Administered 2020-04-07: 100 mL via INTRAVENOUS

## 2020-04-07 MED ORDER — SODIUM CHLORIDE 0.9 % IV BOLUS
500.0000 mL | Freq: Once | INTRAVENOUS | Status: AC
  Administered 2020-04-07: 500 mL via INTRAVENOUS

## 2020-04-07 NOTE — ED Triage Notes (Signed)
Pt states that she was sent by PCP, Dr. Laurance Flatten. Per Daughter, pt has had vaginal pain, redness and discharge for a month. Pt was placed on abx with no relief. Pt is now experiencing dysuria. Pt is A&Ox4.

## 2020-04-07 NOTE — ED Provider Notes (Signed)
[facility] DEPT Provider Note   CSN: [id number] Arrival date & time: 04/07/20  1557     History Chief Complaint  Patient presents with  . Dysuria  . Vaginal Pain    Amanda Rocha is a 86 y.o. female with a past medical history of hypertension, colitis presenting to the ED with a chief complaint of dysuria, abdominal pain.  Daughter at the bedside provides majority of history.  States that last month patient had similar symptoms and was diagnosed with a UTI.  She was placed on 7 days worth of antibiotics which she completed.  States that she continues to be symptomatic and was sent to the ER for further evaluation from PCPs office today.  Daughter noticed for the past week that her urine has been cloudier.  She has not been taking any medications to help with her symptoms.  She reports lower abdominal pain.  She denies any vaginal discharge.  Denies any new soaps, lotions, detergents.  No chest pain, shortness of breath, cough or fever.  HPI     Past Medical History:  Diagnosis Date  . Arthritis    "knees" (04/25/2017)  . Colitis   . Hypertension   . New onset atrial fibrillation ([place]) 04/24/2017   Archie Endo 04/24/2017  . Syncope   . Uterine cancer ([place]) 1960s   "healed of it; no treatment"     Patient Active Problem List   Diagnosis Date Noted  . Constipation 04/30/2019  . Right hip pain 02/09/2019  . Decreased estrogen level 02/09/2019  . Chronic pain of right knee 10/18/2018  . Vision disturbance 10/18/2018  . Elevated troponin 04/24/2017  . Hyperglycemia 04/24/2017  . Syncope   . Colitis   . New onset a-fib ([place])   . Essential hypertension   . Atrial fibrillation [facility])     Past Surgical History:  Procedure Laterality Date  . JOINT REPLACEMENT    . TOTAL KNEE ARTHROPLASTY Left 07/2007     OB History   No obstetric history on file.     Family History  Problem Relation Age of Onset  . Diabetes Mother   . Hypertension Father       Social History   Tobacco Use  . Smoking status: Never Smoker  . Smokeless tobacco: Never Used  Vaping Use  . Vaping Use: Never used  Substance Use Topics  . Alcohol use: Never  . Drug use: Never    Home Medications Prior to Admission medications   Medication Sig Start Date End Date Taking? Authorizing Provider  acetaminophen (TYLENOL) 500 MG tablet Take 1 tablet (500 mg total) by mouth in the morning and at bedtime. 03/31/20  Yes Minette Brine, FNP  aspirin EC 81 MG tablet Take 81 mg by mouth daily.   Yes [provider]  lisinopril-hydrochlorothiazide (ZESTORETIC) 20-12.5 MG tablet Take one tablet by mouth once daily if blood pressure is >140/90 in evening take 2nd tablet 12/01/19  Yes Minette Brine, FNP  lubiprostone (AMITIZA) 8 MCG capsule Take 1 capsule (8 mcg total) by mouth daily with breakfast. 03/31/20  Yes Minette Brine, FNP  Multiple Vitamin (MULTIVITAMIN WITH MINERALS) TABS tablet Take 1 tablet by mouth daily.   Yes [provider]  levofloxacin (LEVAQUIN) 750 MG tablet Take 1 tablet (750 mg total) by mouth daily for 7 days. Patient not taking: Reported on 04/07/2020 04/05/20 04/12/20  Minette Brine, FNP  metroNIDAZOLE (FLAGYL) 500 MG tablet Take 1 tablet (500 mg total) by mouth 3 (three) times  daily for 7 days. 04/07/20 04/14/20  Jazari Ober, PA-C  pantoprazole (PROTONIX) 40 MG tablet Take 1 tablet (40 mg total) by mouth daily. Patient not taking: Reported on 04/07/2020 04/30/19   Minette Brine, FNP  sulfamethoxazole-trimethoprim (BACTRIM DS) 800-160 MG tablet Take 1 tablet by mouth 2 (two) times daily for 7 days. 04/07/20 04/14/20  Delia Heady, PA-C    Allergies    Patient has no known allergies.  Review of Systems   Review of Systems  Constitutional: Negative for appetite change, chills and fever.  HENT: Negative for ear pain, rhinorrhea, sneezing and sore throat.   Eyes: Negative for photophobia and visual disturbance.  Respiratory: Negative for cough,  chest tightness, shortness of breath and wheezing.   Cardiovascular: Negative for chest pain and palpitations.  Gastrointestinal: Positive for abdominal pain. Negative for blood in stool, constipation, diarrhea, nausea and vomiting.  Genitourinary: Positive for dysuria. Negative for hematuria and urgency.  Musculoskeletal: Negative for myalgias.  Skin: Negative for rash.  Neurological: Negative for dizziness, weakness and light-headedness.    Physical Exam Updated Vital Signs BP (!) 173/81 (BP Location: Left Arm)   Pulse (!) 49   Temp 98.1 F (36.7 C) (Oral)   Resp 16   SpO2 95%   Physical Exam Vitals and nursing note reviewed.  Constitutional:      General: She is not in acute distress.    Appearance: She is well-developed.  HENT:     Head: Normocephalic and atraumatic.     Nose: Nose normal.  Eyes:     General: No scleral icterus.       Left eye: No discharge.     Conjunctiva/sclera: Conjunctivae normal.  Cardiovascular:     Rate and Rhythm: Normal rate and regular rhythm.     Heart sounds: Normal heart sounds. No murmur heard.  No friction rub. No gallop.   Pulmonary:     Effort: Pulmonary effort is normal. No respiratory distress.     Breath sounds: Normal breath sounds.  Abdominal:     General: Bowel sounds are normal. There is no distension.     Palpations: Abdomen is soft.     Tenderness: There is abdominal tenderness (Suprapubic). There is no guarding.  Musculoskeletal:        General: Normal range of motion.     Cervical back: Normal range of motion and neck supple.  Skin:    General: Skin is warm and dry.     Findings: No rash.  Neurological:     Mental Status: She is alert.     Motor: No abnormal muscle tone.     Coordination: Coordination normal.     ED Results / Procedures / Treatments   Labs (all labs ordered are listed, but only abnormal results are displayed) Labs Reviewed  URINALYSIS, ROUTINE W REFLEX MICROSCOPIC - Abnormal; Notable for the  following components:      Result Value   Color, Urine YELLOW (*)    APPearance TURBID (*)    Hgb urine dipstick MODERATE (*)    Ketones, ur 5 (*)    Protein, ur >=300 (*)    Leukocytes,Ua MODERATE (*)    All other components within normal limits  URINALYSIS, MICROSCOPIC (REFLEX) - Abnormal; Notable for the following components:   Bacteria, UA MANY (*)    All other components within normal limits  COMPREHENSIVE METABOLIC PANEL - Abnormal; Notable for the following components:   Sodium 132 (*)    Chloride 93 (*)  Glucose, Bld 101 (*)    Calcium 8.8 (*)    Albumin 2.5 (*)    All other components within normal limits  CBC WITH DIFFERENTIAL/PLATELET - Abnormal; Notable for the following components:   WBC 10.8 (*)    Hemoglobin 9.6 (*)    HCT 31.3 (*)    MCH 24.7 (*)    RDW 16.9 (*)    Platelets 429 (*)    All other components within normal limits  URINE CULTURE    EKG None  Radiology CT ABDOMEN PELVIS W CONTRAST  Result Date: 04/07/2020 CLINICAL DATA:  Dysuria lower abdominal suprapubic pain EXAM: CT ABDOMEN AND PELVIS WITH CONTRAST TECHNIQUE: Multidetector CT imaging of the abdomen and pelvis was performed using the standard protocol following bolus administration of intravenous contrast. CONTRAST:  168mL OMNIPAQUE IOHEXOL 300 MG/ML  SOLN COMPARISON:  April 24, 2017 FINDINGS: Lower chest: There is mild cardiomegaly with mitral calcifications. A right pleural effusion with patchy airspace opacity at the right lung base is seen. Hepatobiliary: Low-density lesion seen in the posterior right liver lobe, with slight interval increased from the prior exam the largest measuring 1.6 cm. the main portal vein is patent. No evidence of calcified gallstones, gallbladder wall thickening or biliary dilatation. Pancreas: Again noted is pancreatic divisum with mild pancreatic ductal dilatation as on the prior exam. Spleen: Normal in size with a low-density lesion measuring 5 mm in the upper spleen.  Adrenals/Urinary Tract: Both adrenal glands appear normal. No renal calculi or hydronephrosis is seen. There is a 3 cm low-density lesion upper pole the right kidney. Air in fluid is seen within the bladder which could be from recent Foley instrumentation. Stomach/Bowel: The stomach and small bowel are normal in appearance. There appears to be mild wall thickening of the proximal sigmoid colon with scattered colonic diverticula there is a probable focal area narrowing with a markedly dilated midportion of the sigmoid colon with fluid and stool. The distal sigmoid colon and rectum is decompressed. Vascular/Lymphatic: There are no enlarged mesenteric, retroperitoneal, or pelvic lymph nodes. Scattered aortic atherosclerotic calcifications are seen without aneurysmal dilatation. Reproductive: The uterus and adnexa are unremarkable. Other: No evidence of abdominal wall mass or hernia. Musculoskeletal: No acute or significant osseous findings. IMPRESSION: Wall thickening and narrowing of the proximal sigmoid colon with mild surrounding fat stranding changes which could be due to diverticulitis. There is resultant markedly dilated mid sigmoid colon filled with air and stool. Would recommend direct visualization upon resolution of symptoms to rule out obstructing mass lesion. Aortic Atherosclerosis (ICD10-I70.0). Small right pleural effusion with adjacent patchy airspace opacity which be due to consolidation and/or infectious etiology. Electronically Signed   By: Prudencio Pair M.D.   On: 04/07/2020 21:58    Procedures Procedures (including critical care time)  Medications Ordered in ED Medications  sodium chloride 0.9 % bolus 500 mL (0 mLs Intravenous Stopped 04/07/20 2156)  iohexol (OMNIPAQUE) 300 MG/ML solution 100 mL (100 mLs Intravenous Contrast Given 04/07/20 2132)    ED Course  I have reviewed the triage vital signs and the nursing notes.  Pertinent labs & imaging results that were available during my care  of the patient were reviewed by me and considered in my medical decision making (see chart for details).    MDM Rules/Calculators/A&P                          86 year old female with a past medical history of hypertension, colitis presenting  to the ED with a chief complaint of abdominal pain and dysuria.  Patient had similar symptoms last month and diagnosed with a UTI.  She was placed on a weeks worth of antibiotics which she completed.  However she continues to be symptomatic with lower abdominal pain was sent to the ER from PCPs office for a CT scan of the abdomen.  Daughter noticed that the urine has been cloudier than usual.  Denies any vaginal discharge or vaginal complaints to me.  No chest pain or shortness of breath, fever.  On exam abdomen is tender in the lower area without rebound or guarding.  She is afebrile without recent use of antipyretics.  Work-up here significant for urinalysis with many bacteria, moderate leukocytes, sent for culture.  Hemoglobin of 9.6.  CMP unremarkable.  CT of the abdomen pelvis shows possible sigmoid diverticulitis.  They recommend patient have a colonoscopy done once resolution of symptoms occur.  She denies any dark or tarry stools.  Will treat with Bactrim and Flagyl for UTI and diverticulitis.  Patient is agreeable to antibiotic therapy, following up with PCP.   Patient is hemodynamically stable, in NAD, and able to ambulate in the ED. Evaluation does not show pathology that would require ongoing emergent intervention or inpatient treatment. I explained the diagnosis to the patient. Pain has been managed and has no complaints prior to discharge. Patient is comfortable with above plan and is stable for discharge at this time. All questions were answered prior to disposition. Strict return precautions for returning to the ED were discussed. Encouraged follow up with PCP.   An After Visit Summary was printed and given to the patient.   Portions of this note  were generated with [employer]. Dictation errors may occur despite best attempts at proofreading.  Final Clinical Impression(s) / ED Diagnoses Final diagnoses:  Lower urinary tract infectious disease  Diverticulitis    Rx / DC Orders ED Discharge Orders         Ordered    sulfamethoxazole-trimethoprim (BACTRIM DS) 800-160 MG tablet  2 times daily     Discontinue  Reprint     04/07/20 2241    metroNIDAZOLE (FLAGYL) 500 MG tablet  3 times daily     Discontinue  Reprint     04/07/20 2241           Delia Heady, PA-C 04/07/20 2242    Lacretia Leigh, MD 04/08/20 1126

## 2020-04-07 NOTE — Discharge Instructions (Signed)
Take the antibiotics to help with your UTI and diverticulitis. You will need to follow-up with your primary care provider. We have sent your urine for culture, if you need to be on a different antibiotic we will call you and let you know. Return to the ER for worsening pain, fever, chest pain, shortness of breath.

## 2020-04-07 NOTE — ED Provider Notes (Signed)
Medical screening examination/treatment/procedure(s) were conducted as a shared visit with non-physician practitioner(s) and myself.  I personally evaluated the patient during the encounter.    86 year old female here with dysuria.  Has evidence of UTI as well as diverticulitis.  Will place on antibiotics and have her follow-up with her doctor   Lacretia Leigh, MD 04/07/20 2226

## 2020-04-10 LAB — URINE CULTURE: Culture: >=100000 — AB

## 2020-04-14 ENCOUNTER — Other Ambulatory Visit: Payer: Self-pay | Admitting: Nurse Practitioner

## 2020-04-14 DIAGNOSIS — N390 Urinary tract infection, site not specified: Secondary | ICD-10-CM

## 2020-04-14 MED ORDER — LINACLOTIDE 72 MCG PO CAPS: 72.0000 ug | ORAL_CAPSULE | Freq: Every day | ORAL | 5 refills | Status: AC

## 2020-04-15 ENCOUNTER — Other Ambulatory Visit (INDEPENDENT_AMBULATORY_CARE_PROVIDER_SITE_OTHER): Payer: Medicare Other

## 2020-04-15 ENCOUNTER — Other Ambulatory Visit: Payer: Self-pay

## 2020-04-15 DIAGNOSIS — N390 Urinary tract infection, site not specified: Secondary | ICD-10-CM

## 2020-04-15 LAB — POCT URINALYSIS DIPSTICK
Bilirubin, UA: NEGATIVE
Glucose, UA: NEGATIVE
Ketones, UA: NEGATIVE
Nitrite, UA: NEGATIVE
Protein, UA: NEGATIVE
Spec Grav, UA: 1.02 (ref 1.010–1.025)
Urobilinogen, UA: 2 E.U./dL — AB
pH, UA: 7.5 (ref 5.0–8.0)

## 2020-04-16 LAB — URINE CULTURE: Organism ID, Bacteria: NO GROWTH

## 2020-04-27 DIAGNOSIS — R350 Frequency of micturition: Secondary | ICD-10-CM | POA: Diagnosis not present

## 2020-04-27 DIAGNOSIS — N390 Urinary tract infection, site not specified: Secondary | ICD-10-CM | POA: Diagnosis not present

## 2020-05-05 ENCOUNTER — Encounter: Payer: Self-pay | Admitting: Nurse Practitioner

## 2020-05-10 DIAGNOSIS — R404 Transient alteration of awareness: Secondary | ICD-10-CM | POA: Diagnosis not present

## 2020-05-10 DIAGNOSIS — R4020 Unspecified coma: Secondary | ICD-10-CM | POA: Diagnosis not present

## 2020-05-10 DIAGNOSIS — I499 Cardiac arrhythmia, unspecified: Secondary | ICD-10-CM | POA: Diagnosis not present

## 2020-05-10 DIAGNOSIS — R0689 Other abnormalities of breathing: Secondary | ICD-10-CM | POA: Diagnosis not present

## 2020-05-26 DIAGNOSIS — 419620001 Death: Secondary | SNOMED CT | POA: Diagnosis not present

## 2020-05-26 DEATH — deceased

## 2020-06-15 ENCOUNTER — Ambulatory Visit: Payer: Medicare Other | Admitting: Physician Assistant

## 2020-06-17 ENCOUNTER — Ambulatory Visit: Payer: Medicare Other | Admitting: Nurse Practitioner
